# Patient Record
Sex: Male | Born: 1970
Health system: Southern US, Community
[De-identification: ages and names within clinical notes are randomized; demographics above are authoritative.]

## PROBLEM LIST (undated history)

## (undated) DIAGNOSIS — E119 Type 2 diabetes mellitus without complications: Secondary | ICD-10-CM

## (undated) DIAGNOSIS — G4733 Obstructive sleep apnea (adult) (pediatric): Secondary | ICD-10-CM

## (undated) DIAGNOSIS — E7800 Pure hypercholesterolemia, unspecified: Secondary | ICD-10-CM

## (undated) DIAGNOSIS — I2699 Other pulmonary embolism without acute cor pulmonale: Secondary | ICD-10-CM

## (undated) HISTORY — DX: Type 2 diabetes mellitus without complications: E11.9

## (undated) HISTORY — PX: EYE SURGERY: SHX253

## (undated) HISTORY — DX: Obstructive sleep apnea (adult) (pediatric): G47.33

---

## 1998-09-29 ENCOUNTER — Emergency Department (HOSPITAL_COMMUNITY): Admission: EM | Admit: 1998-09-29 | Discharge: 1998-09-29 | Payer: Self-pay | Admitting: Emergency Medicine

## 1998-10-20 ENCOUNTER — Emergency Department (HOSPITAL_COMMUNITY): Admission: EM | Admit: 1998-10-20 | Discharge: 1998-10-20 | Payer: Self-pay | Admitting: Internal Medicine

## 1999-06-26 ENCOUNTER — Emergency Department (HOSPITAL_COMMUNITY): Admission: EM | Admit: 1999-06-26 | Discharge: 1999-06-26 | Payer: Self-pay | Admitting: Emergency Medicine

## 2003-11-10 ENCOUNTER — Emergency Department (HOSPITAL_COMMUNITY): Admission: EM | Admit: 2003-11-10 | Discharge: 2003-11-10 | Payer: Self-pay | Admitting: Emergency Medicine

## 2011-02-25 ENCOUNTER — Emergency Department (HOSPITAL_COMMUNITY)
Admission: EM | Admit: 2011-02-25 | Discharge: 2011-02-26 | Disposition: A | Discharge status: Home / Self Care | Payer: BC Managed Care – PPO | Attending: Emergency Medicine | Admitting: Emergency Medicine

## 2011-02-25 DIAGNOSIS — R221 Localized swelling, mass and lump, neck: Secondary | ICD-10-CM | POA: Insufficient documentation

## 2011-02-25 DIAGNOSIS — H9209 Otalgia, unspecified ear: Secondary | ICD-10-CM | POA: Insufficient documentation

## 2011-02-25 DIAGNOSIS — H6690 Otitis media, unspecified, unspecified ear: Secondary | ICD-10-CM | POA: Insufficient documentation

## 2011-02-25 DIAGNOSIS — H60399 Other infective otitis externa, unspecified ear: Secondary | ICD-10-CM | POA: Insufficient documentation

## 2011-02-25 DIAGNOSIS — R220 Localized swelling, mass and lump, head: Secondary | ICD-10-CM | POA: Insufficient documentation

## 2011-02-26 ENCOUNTER — Encounter (HOSPITAL_COMMUNITY): Payer: Self-pay

## 2011-02-26 ENCOUNTER — Emergency Department (HOSPITAL_COMMUNITY): Payer: BC Managed Care – PPO

## 2011-02-26 MED ORDER — IOHEXOL 300 MG/ML  SOLN: 100.0000 mL | Freq: Once | INTRAMUSCULAR | Status: DC | PRN

## 2011-12-18 ENCOUNTER — Encounter (HOSPITAL_COMMUNITY): Payer: Self-pay | Admitting: *Deleted

## 2011-12-18 ENCOUNTER — Emergency Department (HOSPITAL_COMMUNITY): Payer: BC Managed Care – PPO

## 2011-12-18 ENCOUNTER — Inpatient Hospital Stay (HOSPITAL_COMMUNITY)
Admission: EM | Admit: 2011-12-18 | Discharge: 2011-12-24 | DRG: 541 | Disposition: A | Discharge status: Home / Self Care | Payer: BC Managed Care – PPO | Attending: Internal Medicine | Admitting: Internal Medicine

## 2011-12-18 DIAGNOSIS — R509 Fever, unspecified: Secondary | ICD-10-CM | POA: Diagnosis not present

## 2011-12-18 DIAGNOSIS — J159 Unspecified bacterial pneumonia: Secondary | ICD-10-CM | POA: Diagnosis not present

## 2011-12-18 DIAGNOSIS — R079 Chest pain, unspecified: Secondary | ICD-10-CM | POA: Diagnosis present

## 2011-12-18 DIAGNOSIS — D72829 Elevated white blood cell count, unspecified: Secondary | ICD-10-CM | POA: Diagnosis present

## 2011-12-18 DIAGNOSIS — Z832 Family history of diseases of the blood and blood-forming organs and certain disorders involving the immune mechanism: Secondary | ICD-10-CM

## 2011-12-18 DIAGNOSIS — R091 Pleurisy: Secondary | ICD-10-CM | POA: Diagnosis present

## 2011-12-18 DIAGNOSIS — I498 Other specified cardiac arrhythmias: Secondary | ICD-10-CM | POA: Diagnosis present

## 2011-12-18 DIAGNOSIS — R7309 Other abnormal glucose: Secondary | ICD-10-CM | POA: Diagnosis present

## 2011-12-18 DIAGNOSIS — I2699 Other pulmonary embolism without acute cor pulmonale: Secondary | ICD-10-CM

## 2011-12-18 DIAGNOSIS — R7303 Prediabetes: Secondary | ICD-10-CM | POA: Diagnosis present

## 2011-12-18 DIAGNOSIS — J189 Pneumonia, unspecified organism: Secondary | ICD-10-CM | POA: Diagnosis present

## 2011-12-18 DIAGNOSIS — Z79899 Other long term (current) drug therapy: Secondary | ICD-10-CM

## 2011-12-18 DIAGNOSIS — E7800 Pure hypercholesterolemia, unspecified: Secondary | ICD-10-CM | POA: Diagnosis present

## 2011-12-18 DIAGNOSIS — R0602 Shortness of breath: Secondary | ICD-10-CM | POA: Diagnosis present

## 2011-12-18 HISTORY — DX: Pure hypercholesterolemia, unspecified: E78.00

## 2011-12-18 LAB — COMPREHENSIVE METABOLIC PANEL
AST: 24 U/L (ref 0–37)
Albumin: 3.9 g/dL (ref 3.5–5.2)
BUN: 10 mg/dL (ref 6–23)
Calcium: 9.8 mg/dL (ref 8.4–10.5)
Chloride: 99 mEq/L (ref 96–112)
Creatinine, Ser: 1.18 mg/dL (ref 0.50–1.35)
Total Bilirubin: 0.3 mg/dL (ref 0.3–1.2)
Total Protein: 7.5 g/dL (ref 6.0–8.3)

## 2011-12-18 LAB — APTT: aPTT: 28 seconds (ref 24–37)

## 2011-12-18 LAB — CBC
HCT: 41.6 % (ref 39.0–52.0)
MCHC: 32.7 g/dL (ref 30.0–36.0)
MCV: 68.4 fL — ABNORMAL LOW (ref 78.0–100.0)
Platelets: 275 10*3/uL (ref 150–400)
RDW: 14.7 % (ref 11.5–15.5)
WBC: 8.1 10*3/uL (ref 4.0–10.5)

## 2011-12-18 LAB — D-DIMER, QUANTITATIVE: D-Dimer, Quant: 1.04 ug/mL-FEU — ABNORMAL HIGH (ref 0.00–0.48)

## 2011-12-18 LAB — POCT I-STAT TROPONIN I: Troponin i, poc: 0 ng/mL (ref 0.00–0.08)

## 2011-12-18 LAB — PROTIME-INR
INR: 0.95 (ref 0.00–1.49)
Prothrombin Time: 12.9 seconds (ref 11.6–15.2)

## 2011-12-18 MED ORDER — SODIUM CHLORIDE 0.9 % IV SOLN
1000.0000 mL | INTRAVENOUS | Status: DC
  Administered 2011-12-18: 1000 mL via INTRAVENOUS

## 2011-12-18 MED ORDER — ASPIRIN 81 MG PO CHEW
324.0000 mg | CHEWABLE_TABLET | Freq: Once | ORAL | Status: AC
  Administered 2011-12-18: 324 mg via ORAL
  Filled 2011-12-18: qty 4

## 2011-12-18 MED ORDER — IOHEXOL 300 MG/ML  SOLN
100.0000 mL | Freq: Once | INTRAMUSCULAR | Status: AC | PRN
  Administered 2011-12-18: 100 mL via INTRAVENOUS

## 2011-12-18 MED ORDER — MORPHINE SULFATE 4 MG/ML IJ SOLN
4.0000 mg | Freq: Once | INTRAMUSCULAR | Status: AC
  Administered 2011-12-18: 4 mg via INTRAVENOUS
  Filled 2011-12-18: qty 1

## 2011-12-18 NOTE — ED Notes (Signed)
Pt reports rt chest pain that awoke him from his sleep yesterday morning approx 0200 - pt denies any dizziness, syncope, shortness of breath, or n/v. Pt denies any extended time spent sitting, also reports increased pain w/ movement and deep inspiration. Pt in no acute distress on assessment, speaking complete sentences.

## 2011-12-18 NOTE — ED Provider Notes (Signed)
History     CSN: [id number]  Arrival date & time 12/18/11  2019   First MD Initiated Contact with Patient 12/18/11 2038      Chief Complaint  Patient presents with  . Chest Pain    Patient is a 41 y.o. male presenting with chest pain. The history is provided by the patient.  Chest Pain The chest pain began 12 - 24 hours ago. Chest pain occurs constantly. The chest pain is worsening. The pain is associated with breathing. The pain is currently at 4/10. The quality of the pain is described as pleuritic and sharp. The pain radiates to the upper back. Chest pain is worsened by deep breathing. Primary symptoms include shortness of breath and cough. Pertinent negatives for primary symptoms include no fever and no vomiting.  Pertinent negatives for past medical history include no CAD and no PE. Family history comments: Lung cancer in mother     Past Medical History  Diagnosis Date  . Hypercholesteremia     History reviewed. No pertinent past surgical history.  History reviewed. No pertinent family history.  History  Substance Use Topics  . Smoking status: Former Games developer  . Smokeless tobacco: Not on file  . Alcohol Use: Yes     occ      Review of Systems  Constitutional: Negative for fever.  Respiratory: Positive for cough and shortness of breath.   Cardiovascular: Positive for chest pain.  Gastrointestinal: Negative for vomiting.  All other systems reviewed and are negative.    Allergies  Review of patient's allergies indicates no known allergies.  Home Medications   Current Outpatient Rx  Name Route Sig Dispense Refill  . OMEGA-3 FATTY ACIDS 1000 MG PO CAPS Oral Take 2 g by mouth daily.    . ADULT MULTIVITAMIN W/MINERALS CH Oral Take 1 tablet by mouth daily.    Marland Kitchen OMEPRAZOLE 20 MG PO CPDR Oral Take 20 mg by mouth daily as needed. For indigestion.    Marland Kitchen PRESCRIPTION MEDICATION Oral Take 1 tablet by mouth daily. Cholesterol medication.    Marland Kitchen VITAMIN C 500 MG PO TABS Oral  Take 500 mg by mouth daily.      BP 162/96  Pulse 104  Temp 99.2 F (37.3 C)  Resp 20  Ht 5\' 9"  (1.753 m)  Wt 246 lb (111.585 kg)  BMI 36.33 kg/m2  SpO2 95%  Physical Exam  Nursing note and vitals reviewed. Constitutional: He appears well-developed and well-nourished. No distress.  HENT:  Head: Normocephalic and atraumatic.  Right Ear: External ear normal.  Left Ear: External ear normal.  Eyes: Conjunctivae are normal. Right eye exhibits no discharge. Left eye exhibits no discharge. No scleral icterus.  Neck: Neck supple. No tracheal deviation present.  Cardiovascular: Normal rate, regular rhythm and intact distal pulses.   Pulmonary/Chest: Effort normal and breath sounds normal. No stridor. No respiratory distress. He has no wheezes. He has no rales.  Abdominal: Soft. Bowel sounds are normal. He exhibits no distension. There is no tenderness. There is no rebound and no guarding.  Musculoskeletal: He exhibits no edema and no tenderness.  Neurological: He is alert. He has normal strength. No sensory deficit. Cranial nerve deficit:  no gross defecits noted. He exhibits normal muscle tone. He displays no seizure activity. Coordination normal.  Skin: Skin is warm and dry. No rash noted.  Psychiatric: He has a normal mood and affect.    ED Course  Procedures (including critical care time)  Rate: 101  Rhythm:  sinus tahcycardia  QRS Axis: normal  Intervals: normal  ST/T Wave abnormalities: normal  Conduction Disutrbances:none  Narrative Interpretation:   Old EKG Reviewed: none available CRITICAL CARE Performed by: Linwood Dibbles R Total critical care time: 40 Critical care time was exclusive of separately billable procedures and treating other patients. Critical care was necessary to treat or prevent imminent or life-threatening deterioration. Critical care was time spent personally by me on the following activities: development of treatment plan with patient and/or surrogate as  well as nursing, discussions with consultants, evaluation of patient's response to treatment, examination of patient, obtaining history from patient or surrogate, ordering and performing treatments and interventions, ordering and review of laboratory studies, ordering and review of radiographic studies, pulse oximetry and re-evaluation of patient's condition.   Labs Reviewed  CBC - Abnormal; Notable for the following:    RBC 6.08 (*)    MCV 68.4 (*)    MCH 22.4 (*)    All other components within normal limits  COMPREHENSIVE METABOLIC PANEL - Abnormal; Notable for the following:    Glucose, Bld 120 (*)    GFR calc non Af Amer 75 (*)    GFR calc Af Amer 86 (*)    All other components within normal limits  D-DIMER, QUANTITATIVE - Abnormal; Notable for the following:    D-Dimer, Quant 1.04 (*)    All other components within normal limits  PROTIME-INR  APTT  POCT I-STAT TROPONIN I   Dg Chest 2 View  12/18/2011  *RADIOLOGY REPORT*  Clinical Data: Chest and right-sided back pain.  Shortness of breath.  CHEST - 2 VIEW  Comparison: No priors.  Findings: Lung volumes are low.  There are bibasilar opacities which are linear in appearance, favored to represent areas of atelectasis.  Underlying air space consolidation is difficult to its entirely exclude, but is not strongly favored.  There is minimal blunting of the costophrenic sulci which may be secondary to the low lung volumes, however, small bilateral pleural effusions would be difficult to exclude.  Pulmonary vasculature is normal. Cardiomediastinal silhouette is within normal limits.  IMPRESSION: 1.  Low lung volumes with bibasilar opacities are favored to represent areas of subsegmental atelectasis, however, underlying airspace consolidation from aspiration or infection is difficult to exclude.  There may also be small bilateral pleural effusions.  Original Report Authenticated By: Florencia Reasons, M.D.   Ct Angio Chest W/cm &/or Wo  Cm  12/18/2011  *RADIOLOGY REPORT*  Clinical Data: Chest pain.  Elevated D-dimer.  CT ANGIOGRAPHY CHEST  Technique:  Multidetector CT imaging of the chest using the standard protocol during bolus administration of intravenous contrast. Multiplanar reconstructed images including MIPs were obtained and reviewed to evaluate the vascular anatomy.  Contrast: OMNIPAQUE IOHEXOL 300 MG/ML  SOLN  Comparison: None  Findings: The chest wall is unremarkable.  No supraclavicular or axillary adenopathy.  The thyroid gland is normal.  The bony thorax is intact.  The thoracic vertebral bodies are normally aligned.  The heart is upper limits of normal in size.  No pericardial effusion.  No mediastinal or hilar lymphadenopathy.  The aorta is normal in caliber.  No dissection.  The esophagus is grossly normal.  The pulmonary arterial tree is fairly well opacified.  There is a large central pulmonary embolus involving the right pulmonary artery and lower lobe branch vessels.  Small left-sided pulmonary emboli are also suspected.  Examination of the lung parenchyma demonstrates vascular congestion and bibasilar atelectasis.  Vague ground-glass opacity could reflect  mild edema.  No definite pleural effusions.  The upper abdomen is unremarkable.  IMPRESSION:  1.  Bilateral pulmonary emboli with a large central embolus on the right.  Right ventricular strain pattern noted. 2.  Vascular congestion, mild edema and areas of atelectasis. 3.  Normal thoracic aorta.  Critical Value/emergent results were called by telephone at the time of interpretation to  Dr. Linwood Dibbles, who verbally acknowledged these results.  Original Report Authenticated By: P. Loralie Champagne, M.D.     1. Pulmonary embolism       MDM  Pt with a large PE on CT scan.  Discussed with Dr Darrick Penna , PCCM, regarding the possible use of tpa considering his RV strain and large tumor burden.  Does not recommend this treatment at this time.  Will consult the  hospitalist service for admission.  Heparin IV has been ordered        Celene Kras, MD 12/18/11 2355

## 2011-12-18 NOTE — ED Notes (Addendum)
Pt c/o right chest/epigastric pain that radiates to back. Also reports indigestion. Reports pain started last night. Pt reports pain is worse when laying. Pt denies vomiting/diarrhea. States "I belched all day at work and passed gas."

## 2011-12-19 DIAGNOSIS — I10 Essential (primary) hypertension: Secondary | ICD-10-CM

## 2011-12-19 DIAGNOSIS — R079 Chest pain, unspecified: Secondary | ICD-10-CM

## 2011-12-19 DIAGNOSIS — R0602 Shortness of breath: Secondary | ICD-10-CM

## 2011-12-19 DIAGNOSIS — I2699 Other pulmonary embolism without acute cor pulmonale: Secondary | ICD-10-CM | POA: Diagnosis present

## 2011-12-19 LAB — BASIC METABOLIC PANEL
CO2: 26 mEq/L (ref 19–32)
Calcium: 9.4 mg/dL (ref 8.4–10.5)
Creatinine, Ser: 1.09 mg/dL (ref 0.50–1.35)
GFR calc non Af Amer: 82 mL/min — ABNORMAL LOW (ref >90)

## 2011-12-19 LAB — CBC
HCT: 41.1 % (ref 39.0–52.0)
MCHC: 31.9 g/dL (ref 30.0–36.0)
Platelets: 262 10*3/uL (ref 150–400)
RDW: 14.9 % (ref 11.5–15.5)
WBC: 8.4 10*3/uL (ref 4.0–10.5)

## 2011-12-19 LAB — HEMOGLOBIN A1C: Hgb A1c MFr Bld: 6.1 % — ABNORMAL HIGH (ref <5.7)

## 2011-12-19 LAB — MRSA PCR SCREENING: MRSA by PCR: NEGATIVE

## 2011-12-19 LAB — ANTITHROMBIN III: AntiThromb III Func: 93 % (ref 75–120)

## 2011-12-19 LAB — HEPARIN LEVEL (UNFRACTIONATED)
Heparin Unfractionated: 0.75 IU/mL — ABNORMAL HIGH (ref 0.30–0.70)
Heparin Unfractionated: 0.67 IU/mL (ref 0.30–0.70)

## 2011-12-19 LAB — PROTIME-INR: INR: 0.97 (ref 0.00–1.49)

## 2011-12-19 LAB — PRO B NATRIURETIC PEPTIDE: Pro B Natriuretic peptide (BNP): 19.4 pg/mL (ref 0–125)

## 2011-12-19 MED ORDER — SODIUM CHLORIDE 0.9 % IV SOLN: INTRAVENOUS | Status: AC

## 2011-12-19 MED ORDER — HYDROCODONE-ACETAMINOPHEN 5-325 MG PO TABS
1.0000 | ORAL_TABLET | ORAL | Status: DC | PRN
  Administered 2011-12-19 (×3): 2 via ORAL
  Filled 2011-12-19 (×3): qty 2

## 2011-12-19 MED ORDER — HEPARIN (PORCINE) IN NACL 100-0.45 UNIT/ML-% IJ SOLN
1800.0000 [IU]/h | INTRAMUSCULAR | Status: DC
  Administered 2011-12-19 – 2011-12-20 (×2): 1500 [IU]/h via INTRAVENOUS
  Administered 2011-12-21 – 2011-12-24 (×5): 1800 [IU]/h via INTRAVENOUS
  Filled 2011-12-19 (×12): qty 250

## 2011-12-19 MED ORDER — HYDRALAZINE HCL 10 MG PO TABS
10.0000 mg | ORAL_TABLET | Freq: Four times a day (QID) | ORAL | Status: DC | PRN
  Administered 2011-12-19: 10 mg via ORAL
  Filled 2011-12-19: qty 1

## 2011-12-19 MED ORDER — WARFARIN SODIUM 7.5 MG PO TABS
7.5000 mg | ORAL_TABLET | Freq: Once | ORAL | Status: DC
  Filled 2011-12-19: qty 1

## 2011-12-19 MED ORDER — WARFARIN SODIUM 10 MG PO TABS
10.0000 mg | ORAL_TABLET | Freq: Once | ORAL | Status: AC
  Administered 2011-12-19: 10 mg via ORAL
  Filled 2011-12-19: qty 1

## 2011-12-19 MED ORDER — WARFARIN - PHARMACIST DOSING INPATIENT: Freq: Every day | Status: DC

## 2011-12-19 MED ORDER — COUMADIN BOOK
1.0000 | Freq: Once | Status: AC
  Administered 2011-12-19: 1
  Filled 2011-12-19: qty 1

## 2011-12-19 MED ORDER — HEPARIN (PORCINE) IN NACL 100-0.45 UNIT/ML-% IJ SOLN
1600.0000 [IU]/h | INTRAMUSCULAR | Status: DC
  Administered 2011-12-19: 1600 [IU]/h via INTRAVENOUS
  Filled 2011-12-19: qty 250

## 2011-12-19 MED ORDER — HEPARIN BOLUS VIA INFUSION
5000.0000 [IU] | Freq: Once | INTRAVENOUS | Status: AC
  Administered 2011-12-19: 5000 [IU] via INTRAVENOUS

## 2011-12-19 MED ORDER — WARFARIN VIDEO
Freq: Once | Status: AC
  Administered 2011-12-20: 12:00:00

## 2011-12-19 NOTE — Progress Notes (Signed)
ANTICOAGULATION CONSULT NOTE - Follow Up Consult  Pharmacy Consult for Heparin Indication: pulmonary embolus  No Known Allergies  Patient Measurements: Height: 5\' 9"  (175.3 cm) Weight: 246 lb (111.585 kg) IBW/kg (Calculated) : 70.7  Heparin Dosing Weight:   Vital Signs: Temp: 99 F (37.2 C) (06/05 1200) Temp src: Oral (06/05 1200) BP: 159/86 mmHg (06/05 0420) Pulse Rate: 97  (06/05 0800)  Labs:  Basename 12/19/11 1310 12/19/11 0610 12/18/11 2145  HGB -- 13.1 13.6  HCT -- 41.1 41.6  PLT -- 262 275  APTT -- -- 28  LABPROT -- 13.1 12.9  INR -- 0.97 0.95  HEPARINUNFRC 0.67 0.75* --  CREATININE -- 1.09 1.18  CKTOTAL -- -- --  CKMB -- -- --  TROPONINI -- -- --    Estimated Creatinine Clearance: 108.8 ml/min (by C-G formula based on Cr of 1.09).   Medications:  Infusions:     . sodium chloride 75 mL/hr at 12/19/11 0500  . heparin 1,500 Units/hr (12/19/11 1300)  . DISCONTD: sodium chloride 1,000 mL (12/18/11 2151)  . DISCONTD: heparin 1,600 Units/hr (12/19/11 0013)    Assessment:  41 yo M with acute bilateral pulmonary emboli started on heparin IV and warfarin on 6/5   Hypercoagulable workup pending (sister and maternal grandmother have both have had a PE)  CBC stable, WNL  First heparin level this morning high, and rate was decreased  HL now therapeutic at 0.67  INR at baseline, <1  Goal of Therapy:  Heparin level 0.3-0.7 units/ml Monitor platelets by anticoagulation protocol: Yes   Plan:   Continue Heparin IV infusion at 1500 units/h (15 ml/hr)  Heparin level in 6 hours to confirm therapeutic level  Daily HL  Warfarin 10mg  PO tonight at 1800  Daily INR   Lynann Beaver PharmD, BCPS Pager [phone] 12/19/2011 1:42 PM

## 2011-12-19 NOTE — Progress Notes (Signed)
Patients BP is 179/100 (118); pt is symptomatic; MD from [facility] notified and new PRN orders were received. Will continue to monitor the pt.

## 2011-12-19 NOTE — Progress Notes (Addendum)
ANTICOAGULATION CONSULT NOTE - Initial Consult  Pharmacy Consult for Heparin Indication: Bilat. pulmonary embolus  No Known Allergies  Patient Measurements: Height: 5\' 9"  (175.3 cm) Weight: 246 lb (111.585 kg) IBW/kg (Calculated) : 70.7  Heparin Dosing Weight:   Vital Signs: Temp: 99.2 F (37.3 C) (06/04 2023) BP: 140/82 mmHg (06/05 0013) Pulse Rate: 101  (06/05 0013)  Labs:  [facility] 12/18/11 2145  HGB 13.6  HCT 41.6  PLT 275  APTT 28  LABPROT 12.9  INR 0.95  HEPARINUNFRC --  CREATININE 1.18  CKTOTAL --  CKMB --  TROPONINI --    Estimated Creatinine Clearance: 100.5 ml/min (by C-G formula based on Cr of 1.18).   Medical History: Past Medical History  Diagnosis Date  . Hypercholesteremia     Medications:  Infusions:    . sodium chloride    . heparin    . DISCONTD: sodium chloride 1,000 mL (12/18/11 2151)  . DISCONTD: heparin 1,600 Units/hr (12/19/11 0013)    Assessment: Patient with bilat PE.  Heparin started in ED.   Goal of Therapy:  Heparin level 0.3-0.7 units/ml Monitor platelets by anticoagulation protocol: Yes INR 2-3   Plan:  Heparin bolus and drip at 1600 units/hr (started in ED). Daily heparin level and CBC and INR. Daily PT/INR, Coumadin Book/video Warfarin 7.5mg  po x1 at 1800 Check heparin level at 0630 am   Darlina Guys, Jacquenette Shone Crowford 12/19/2011,2:34 AM  HL this am 0.75, no issues per RN> Will decrease to 1500 units/hr and recheck level at [address] Darlina Guys PharmD, BCPS  12/19/2011, 7:12 AM

## 2011-12-19 NOTE — Progress Notes (Signed)
VASCULAR LAB PRELIMINARY  PRELIMINARY  PRELIMINARY  PRELIMINARY  Bilateral lower extremity venous duplex  completed.    Preliminary report:  Bilateral:  No evidence of DVT, superficial thrombosis, or Baker's Cyst.   Terance Hart, RVT 12/19/2011, 9:47 AM

## 2011-12-19 NOTE — Progress Notes (Signed)
Subjective:  Patient seen and examined this morning. Still c/o some right sided chest discomfort on deep inspiration. Admission H&P reviewed.  Objective:  Vital signs in last 24 hours:  Filed Vitals:   12/19/11 0200 12/19/11 0400 12/19/11 0420 12/19/11 0800  BP: 141/89  159/86   Pulse: 93  84   Temp: 99.2 F (37.3 C) 99.9 F (37.7 C) 99.9 F (37.7 C) 99.9 F (37.7 C)  TempSrc: Oral Oral Oral Oral  Resp: 26  23   Height:      Weight:      SpO2: 96%  95%     Intake/Output from previous day:   Intake/Output Summary (Last 24 hours) at 12/19/11 0854 Last data filed at 12/19/11 0845  Gross per 24 hour  Intake    364 ml  Output   1200 ml  Net   -836 ml    Physical Exam:  General: middle aged obese in no acute distress. HEENT: no pallor, no icterus, moist oral mucosa, no JVD, no lymphadenopathy Heart: Normal  s1 &s2  Regular rate and rhythm, without murmurs, rubs, gallops. Lungs: Clear to auscultation bilaterally. Abdomen: Soft, nontender, nondistended, positive bowel sounds. Extremities: No clubbing cyanosis or edema with positive pedal pulses. Neuro: Alert, awake, oriented x3, nonfocal.   Lab Results:  Basic Metabolic Panel:    Component Value Date/Time   NA 136 12/19/2011 0610   K 4.1 12/19/2011 0610   CL 100 12/19/2011 0610   CO2 26 12/19/2011 0610   BUN 11 12/19/2011 0610   CREATININE 1.09 12/19/2011 0610   GLUCOSE 125* 12/19/2011 0610   CALCIUM 9.4 12/19/2011 0610   CBC:    Component Value Date/Time   WBC 8.4 12/19/2011 0610   HGB 13.1 12/19/2011 0610   HCT 41.1 12/19/2011 0610   PLT 262 12/19/2011 0610   MCV 68.7* 12/19/2011 0610    Recent Results (from the past 240 hour(s))  MRSA PCR SCREENING     Status: Normal   Collection Time   12/19/11  2:59 AM      Component Value Range Status Comment   MRSA by PCR NEGATIVE  NEGATIVE  Final     Studies/Results: Dg Chest 2 View  12/18/2011  *RADIOLOGY REPORT*  Clinical Data: Chest and right-sided back pain.  Shortness of  breath.  CHEST - 2 VIEW  Comparison: No priors.  Findings: Lung volumes are low.  There are bibasilar opacities which are linear in appearance, favored to represent areas of atelectasis.  Underlying air space consolidation is difficult to its entirely exclude, but is not strongly favored.  There is minimal blunting of the costophrenic sulci which may be secondary to the low lung volumes, however, small bilateral pleural effusions would be difficult to exclude.  Pulmonary vasculature is normal. Cardiomediastinal silhouette is within normal limits.  IMPRESSION: 1.  Low lung volumes with bibasilar opacities are favored to represent areas of subsegmental atelectasis, however, underlying airspace consolidation from aspiration or infection is difficult to exclude.  There may also be small bilateral pleural effusions.  Original Report Authenticated By: Florencia Reasons, M.D.   Ct Angio Chest W/cm &/or Wo Cm  12/18/2011  *RADIOLOGY REPORT*  Clinical Data: Chest pain.  Elevated D-dimer.  CT ANGIOGRAPHY CHEST  Technique:  Multidetector CT imaging of the chest using the standard protocol during bolus administration of intravenous contrast. Multiplanar reconstructed images including MIPs were obtained and reviewed to evaluate the vascular anatomy.  Contrast: OMNIPAQUE IOHEXOL 300 MG/ML  SOLN  Comparison:  None  Findings: The chest wall is unremarkable.  No supraclavicular or axillary adenopathy.  The thyroid gland is normal.  The bony thorax is intact.  The thoracic vertebral bodies are normally aligned.  The heart is upper limits of normal in size.  No pericardial effusion.  No mediastinal or hilar lymphadenopathy.  The aorta is normal in caliber.  No dissection.  The esophagus is grossly normal.  The pulmonary arterial tree is fairly well opacified.  There is a large central pulmonary embolus involving the right pulmonary artery and lower lobe branch vessels.  Small left-sided pulmonary emboli are also suspected.   Examination of the lung parenchyma demonstrates vascular congestion and bibasilar atelectasis.  Vague ground-glass opacity could reflect mild edema.  No definite pleural effusions.  The upper abdomen is unremarkable.  IMPRESSION:  1.  Bilateral pulmonary emboli with a large central embolus on the right.  Right ventricular strain pattern noted. 2.  Vascular congestion, mild edema and areas of atelectasis. 3.  Normal thoracic aorta.  Critical Value/emergent results were called by telephone at the time of interpretation to  Dr. Linwood Dibbles, who verbally acknowledged these results.  Original Report Authenticated By: P. Loralie Champagne, M.D.    Medications: Scheduled Meds:   . aspirin  324 mg Oral Once  . coumadin book  1 each Does not apply Once  . heparin  5,000 Units Intravenous Once  .  morphine injection  4 mg Intravenous Once  . warfarin  7.5 mg Oral ONCE-1800  . warfarin   Does not apply Once  . Warfarin - Pharmacist Dosing Inpatient   Does not apply q1800   Continuous Infusions:   . sodium chloride 75 mL/hr at 12/19/11 0500  . heparin    . DISCONTD: sodium chloride 1,000 mL (12/18/11 2151)  . DISCONTD: heparin 1,600 Units/hr (12/19/11 0013)   PRN Meds:.HYDROcodone-acetaminophen, iohexol  Assessment/Plan:   *Pulmonary emboli bilateral Patient does have significant hx of blood clots in family members ( his sister, uncle and maternal grandmother) B/l PE with large right sided central PE WITH some RV train noted on CT angio. patient was tachycardic with low grade temp and short of breath on presentation and placed on stepdown monitofin hypercoagulable w/up sent on admisison  continue IV heparin drip for now. Possibly could be discharged on po xaralto  Vs sq lovenox once more stable Will check LE doppler for DVT Continue stepdown monitoring for now   right sided Chest pain Likely related to underlying PE and currently improving. Continue prn vicodin  DVT prophylaxis On IV  heaprin  Diet: regular      LOS: 1 day   [name] [name] 12/19/2011, 8:54 AM

## 2011-12-19 NOTE — Progress Notes (Addendum)
PHARMACY BRIEF NOTE - ANTICOAGULATION  Pharmacy Consult for:   IV Heparin Indication:  Pulmonary embolus   With infusion of 1500 units/hr, the heparin level drawn at 19:55 tonight is reported as 0.62 units/ml.  This level is within the therapeutic range  Goal of Therapy:   Heparin level 0.3-0.7 units/ml  Monitor platelets by anticoagulation protocol: Yes   Plan:   Continue the current infusion rate overnight.  Next heparin level and CBC with morning labs.  Polo Riley R.Ph. 9:44 PM 12/19/2011

## 2011-12-19 NOTE — H&P (Signed)
Chief Complaint:  Pain chest  HPI:  41 year old male who approximately 2 AM yesterday  morning had sudden right-sided chest pain and shortness of breath that woke him up from sleep. He denies any recent traveling, reason lower extremity edema or swelling or pain in his calf , trauma. He has not had any recent surgeries or immunization. He denies any recent illnesses, no fevers, rashes, nausea, vomiting, or diarrhea. He has one sister who had a pulmonary emboli last year attributed to estrogen use and a maternal grandmother also had a pulmonary emboli in her 70s. He has never had a blood clot before. CT scan of his chest shows significant pulmonary emboli bilaterally.   Review of Systems:   otherwise negative   Past Medical History: Past Medical History  Diagnosis Date  . Hypercholesteremia    History reviewed. No pertinent past surgical history.  Medications: Prior to Admission medications   Medication Sig Start Date End Date Taking? Authorizing Provider  fish oil-omega-3 fatty acids 1000 MG capsule Take 2 g by mouth daily.   Yes Historical Provider, MD  Multiple Vitamin (MULITIVITAMIN WITH MINERALS) TABS Take 1 tablet by mouth daily.   Yes Historical Provider, MD  omeprazole (PRILOSEC) 20 MG capsule Take 20 mg by mouth daily as needed. For indigestion.   Yes Historical Provider, MD  PRESCRIPTION MEDICATION Take 1 tablet by mouth daily. Cholesterol medication.   Yes Historical Provider, MD  vitamin C (ASCORBIC ACID) 500 MG tablet Take 500 mg by mouth daily.   Yes Historical Provider, MD    Allergies:  No Known Allergies  Social History:  reports that he has quit smoking. He does not have any smokeless tobacco history on file. He reports that he drinks alcohol. He reports that he does not use illicit drugs.  Family History: And he has a sister who is a year or he had a pulmonary emboli last year. And a maternal grandmother had blood clots in the lungs in her 31s.   Physical  Exam: Filed Vitals:   12/18/11 2023 12/18/11 2028  BP: 162/96   Pulse: 104   Temp: 99.2 F (37.3 C)   Resp: 20   Height:  5\' 9"  (1.753 m)  Weight:  111.585 kg (246 lb)  SpO2: 95%    General appearance: alert, cooperative and no distress Lungs: clear to auscultation bilaterally Heart: regular rate and rhythm, S1, S2 normal, no murmur, click, rub or gallop Abdomen: soft, non-tender; bowel sounds normal; no masses,  no organomegaly Extremities: extremities normal, atraumatic, no cyanosis or edema Pulses: 2+ and symmetric Skin: Skin color, texture, turgor normal. No rashes or lesions Neurologic: Grossly normal    Labs on Admission:   [facility] 12/18/11 2145  NA 135  K 3.9  CL 99  CO2 26  GLUCOSE 120*  BUN 10  CREATININE 1.18  CALCIUM 9.8  MG --  PHOS --    Basename 12/18/11 2145  AST 24  ALT 28  ALKPHOS 86  BILITOT 0.3  PROT 7.5  ALBUMIN 3.9    Basename 12/18/11 2145  WBC 8.1  NEUTROABS --  HGB 13.6  HCT 41.6  MCV 68.4*  PLT 275    Radiological Exams on Admission: Dg Chest 2 View  12/18/2011  *RADIOLOGY REPORT*  Clinical Data: Chest and right-sided back pain.  Shortness of breath.  CHEST - 2 VIEW  Comparison: No priors.  Findings: Lung volumes are low.  There are bibasilar opacities which are linear in appearance, favored to represent areas  of atelectasis.  Underlying air space consolidation is difficult to its entirely exclude, but is not strongly favored.  There is minimal blunting of the costophrenic sulci which may be secondary to the low lung volumes, however, small bilateral pleural effusions would be difficult to exclude.  Pulmonary vasculature is normal. Cardiomediastinal silhouette is within normal limits.  IMPRESSION: 1.  Low lung volumes with bibasilar opacities are favored to represent areas of subsegmental atelectasis, however, underlying airspace consolidation from aspiration or infection is difficult to exclude.  There may also be small bilateral  pleural effusions.  Original Report Authenticated By: Florencia Reasons, M.D.   Ct Angio Chest W/cm &/or Wo Cm  12/18/2011  *RADIOLOGY REPORT*  Clinical Data: Chest pain.  Elevated D-dimer.  CT ANGIOGRAPHY CHEST  Technique:  Multidetector CT imaging of the chest using the standard protocol during bolus administration of intravenous contrast. Multiplanar reconstructed images including MIPs were obtained and reviewed to evaluate the vascular anatomy.  Contrast: OMNIPAQUE IOHEXOL 300 MG/ML  SOLN  Comparison: None  Findings: The chest wall is unremarkable.  No supraclavicular or axillary adenopathy.  The thyroid gland is normal.  The bony thorax is intact.  The thoracic vertebral bodies are normally aligned.  The heart is upper limits of normal in size.  No pericardial effusion.  No mediastinal or hilar lymphadenopathy.  The aorta is normal in caliber.  No dissection.  The esophagus is grossly normal.  The pulmonary arterial tree is fairly well opacified.  There is a large central pulmonary embolus involving the right pulmonary artery and lower lobe branch vessels.  Small left-sided pulmonary emboli are also suspected.  Examination of the lung parenchyma demonstrates vascular congestion and bibasilar atelectasis.  Vague ground-glass opacity could reflect mild edema.  No definite pleural effusions.  The upper abdomen is unremarkable.  IMPRESSION:  1.  Bilateral pulmonary emboli with a large central embolus on the right.  Right ventricular strain pattern noted. 2.  Vascular congestion, mild edema and areas of atelectasis. 3.  Normal thoracic aorta.  Critical Value/emergent results were called by telephone at the time of interpretation to  Dr. Linwood Dibbles, who verbally acknowledged these results.  Original Report Authenticated By: P. Loralie Champagne, M.D.    Assessment/Plan Present on Admission:  41 year old male with acute bilateral pulmonary emboli  .Chest pain .Pulmonary emboli bilateral .SOB (shortness of  breath)  Placed in  the step down unit on a heparin drip with Coumadin. I sent him for a hypercoagulable workup. His troponin is negative and EKG with some mild sinus tachycardia. We'll monitor him very closely however he is currently hemodynamically stable. I recommended to his sister who is here and is now off Coumadin speak to her doctor about a hypercoagulable workup as they both probably have some underlying clotting disorder. Provide some pain medications for the chest pain.     Shabreka Coulon A [phone] 12/19/2011, 12:14 AM

## 2011-12-20 ENCOUNTER — Inpatient Hospital Stay (HOSPITAL_COMMUNITY): Payer: BC Managed Care – PPO

## 2011-12-20 DIAGNOSIS — R0602 Shortness of breath: Secondary | ICD-10-CM

## 2011-12-20 DIAGNOSIS — I10 Essential (primary) hypertension: Secondary | ICD-10-CM

## 2011-12-20 DIAGNOSIS — R079 Chest pain, unspecified: Secondary | ICD-10-CM

## 2011-12-20 DIAGNOSIS — R509 Fever, unspecified: Secondary | ICD-10-CM

## 2011-12-20 LAB — CBC
HCT: 40.5 % (ref 39.0–52.0)
RBC: 5.88 MIL/uL — ABNORMAL HIGH (ref 4.22–5.81)
RDW: 14.8 % (ref 11.5–15.5)
WBC: 10.8 10*3/uL — ABNORMAL HIGH (ref 4.0–10.5)

## 2011-12-20 LAB — LUPUS ANTICOAGULANT PANEL
DRVVT: 35.9 secs (ref <45.1)
PTT Lupus Anticoagulant: 76.6 secs — ABNORMAL HIGH (ref 28.0–43.0)
PTTLA 4:1 Mix: 78.8 secs — ABNORMAL HIGH (ref 28.0–43.0)
PTTLA Confirmation: 2.8 secs (ref <8.0)

## 2011-12-20 LAB — HEPARIN LEVEL (UNFRACTIONATED): Heparin Unfractionated: 0.49 IU/mL (ref 0.30–0.70)

## 2011-12-20 LAB — PROTEIN C, TOTAL: Protein C, Total: 116 % (ref 72–160)

## 2011-12-20 LAB — FACTOR 5 LEIDEN

## 2011-12-20 MED ORDER — SENNOSIDES-DOCUSATE SODIUM 8.6-50 MG PO TABS
1.0000 | ORAL_TABLET | Freq: Two times a day (BID) | ORAL | Status: DC
  Administered 2011-12-20 – 2011-12-21 (×2): 1 via ORAL
  Filled 2011-12-20 (×9): qty 1

## 2011-12-20 MED ORDER — WARFARIN SODIUM 10 MG PO TABS
10.0000 mg | ORAL_TABLET | Freq: Once | ORAL | Status: AC
  Administered 2011-12-20: 10 mg via ORAL
  Filled 2011-12-20: qty 1

## 2011-12-20 MED ORDER — LEVOFLOXACIN IN D5W 750 MG/150ML IV SOLN
750.0000 mg | INTRAVENOUS | Status: DC
  Administered 2011-12-20 – 2011-12-23 (×4): 750 mg via INTRAVENOUS
  Filled 2011-12-20 (×6): qty 150

## 2011-12-20 NOTE — Progress Notes (Signed)
Subjective: Patient informs his SOB and chest discomfort. Noted to have low grade temp again and increased respiratory rate.  Objective:    Vital signs in last 24 hours:  Filed Vitals:   12/20/11 0415 12/20/11 0500 12/20/11 0800 12/20/11 0900  BP: 141/94   150/87  Pulse: 90   92  Temp:   99.5 F (37.5 C)   TempSrc:   Oral   Resp: 24   29  Height:      Weight:  112.4 kg (247 lb 12.8 oz)    SpO2: 89%   93%    Intake/Output from previous day:   Intake/Output Summary (Last 24 hours) at 12/20/11 1151 Last data filed at 12/20/11 1100  Gross per 24 hour  Intake    420 ml  Output   1500 ml  Net  -1080 ml    Physical Exam:  General: middle aged obese in no acute distress.  HEENT: no pallor, no icterus, moist oral mucosa, no JVD, no lymphadenopathy  Heart: Normal s1 &s2 Regular rate and rhythm, without murmurs, rubs, gallops.  Lungs: Clear to auscultation bilaterally.  Abdomen: Soft, nontender, nondistended, positive bowel sounds.  Extremities: No clubbing cyanosis or edema with positive pedal pulses.  Neuro: Alert, awake, oriented x3, nonfocal.   Lab Results:  Basic Metabolic Panel:    Component Value Date/Time   NA 136 12/19/2011 0610   K 4.1 12/19/2011 0610   CL 100 12/19/2011 0610   CO2 26 12/19/2011 0610   BUN 11 12/19/2011 0610   CREATININE 1.09 12/19/2011 0610   GLUCOSE 125* 12/19/2011 0610   CALCIUM 9.4 12/19/2011 0610   CBC:    Component Value Date/Time   WBC 10.8* 12/20/2011 0332   HGB 13.1 12/20/2011 0332   HCT 40.5 12/20/2011 0332   PLT 250 12/20/2011 0332   MCV 68.9* 12/20/2011 0332    Recent Results (from the past 240 hour(s))  MRSA PCR SCREENING     Status: Normal   Collection Time   12/19/11  2:59 AM      Component Value Range Status Comment   MRSA by PCR NEGATIVE  NEGATIVE  Final     Studies/Results: Dg Chest 2 View  12/18/2011  *RADIOLOGY REPORT*  Clinical Data: Chest and right-sided back pain.  Shortness of breath.  CHEST - 2 VIEW  Comparison: No priors.   Findings: Lung volumes are low.  There are bibasilar opacities which are linear in appearance, favored to represent areas of atelectasis.  Underlying air space consolidation is difficult to its entirely exclude, but is not strongly favored.  There is minimal blunting of the costophrenic sulci which may be secondary to the low lung volumes, however, small bilateral pleural effusions would be difficult to exclude.  Pulmonary vasculature is normal. Cardiomediastinal silhouette is within normal limits.  IMPRESSION: 1.  Low lung volumes with bibasilar opacities are favored to represent areas of subsegmental atelectasis, however, underlying airspace consolidation from aspiration or infection is difficult to exclude.  There may also be small bilateral pleural effusions.  Original Report Authenticated By: Florencia Reasons, M.D.   Ct Angio Chest W/cm &/or Wo Cm  12/18/2011  *RADIOLOGY REPORT*  Clinical Data: Chest pain.  Elevated D-dimer.  CT ANGIOGRAPHY CHEST  Technique:  Multidetector CT imaging of the chest using the standard protocol during bolus administration of intravenous contrast. Multiplanar reconstructed images including MIPs were obtained and reviewed to evaluate the vascular anatomy.  Contrast: OMNIPAQUE IOHEXOL 300 MG/ML  SOLN  Comparison: None  Findings: The chest wall is unremarkable.  No supraclavicular or axillary adenopathy.  The thyroid gland is normal.  The bony thorax is intact.  The thoracic vertebral bodies are normally aligned.  The heart is upper limits of normal in size.  No pericardial effusion.  No mediastinal or hilar lymphadenopathy.  The aorta is normal in caliber.  No dissection.  The esophagus is grossly normal.  The pulmonary arterial tree is fairly well opacified.  There is a large central pulmonary embolus involving the right pulmonary artery and lower lobe branch vessels.  Small left-sided pulmonary emboli are also suspected.  Examination of the lung parenchyma demonstrates  vascular congestion and bibasilar atelectasis.  Vague ground-glass opacity could reflect mild edema.  No definite pleural effusions.  The upper abdomen is unremarkable.  IMPRESSION:  1.  Bilateral pulmonary emboli with a large central embolus on the right.  Right ventricular strain pattern noted. 2.  Vascular congestion, mild edema and areas of atelectasis. 3.  Normal thoracic aorta.  Critical Value/emergent results were called by telephone at the time of interpretation to  Dr. Linwood Dibbles, who verbally acknowledged these results.  Original Report Authenticated By: P. Loralie Champagne, M.D.    Medications: Scheduled Meds:   . coumadin book  1 each Does not apply Once  . warfarin  10 mg Oral ONCE-1800  . warfarin  10 mg Oral ONCE-1800  . warfarin   Does not apply Once  . Warfarin - Pharmacist Dosing Inpatient   Does not apply q1800  . DISCONTD: warfarin  7.5 mg Oral ONCE-1800   Continuous Infusions:   . sodium chloride 75 mL/hr at 12/19/11 0500  . heparin 1,500 Units/hr (12/20/11 0600)   PRN Meds:.hydrALAZINE, HYDROcodone-acetaminophen  Assessment/Plan:   *Pulmonary emboli bilateral  Patient does have significant hx of blood clots in family members ( his sister, uncle and maternal grandmother)  B/l PE with large right sided central PE with some RV strain noted on CT angio. patient was tachycardic with low grade temp and short of breath on presentation and placed on stepdown  hypercoagulable w/up sent on admisison  continue IV heparin drip for now. Possibly could be discharged on po xaralto Vs sq lovenox once more stable   LE doppler negative  for DVT  Continue stepdown monitoring for now  Will check CXR to r/o PNA given low grade temp and leucocytosis   right sided Chest pain  Likely related to underlying PE and currently improving. Continue prn vicodin    Diet: regular   Continue stepdown monitoring for now    LOS: 2 days   [name] [name] 12/20/2011, 11:51 AM

## 2011-12-20 NOTE — Progress Notes (Signed)
ANTICOAGULATION CONSULT NOTE - Follow Up Consult  Pharmacy Consult for Heparin Indication: pulmonary embolus  No Known Allergies  Patient Measurements: Height: 5\' 9"  (175.3 cm) Weight: 247 lb 12.8 oz (112.4 kg) IBW/kg (Calculated) : 70.7   Vital Signs: Temp: 99.9 F (37.7 C) (06/06 0400) Temp src: Oral (06/06 0400) BP: 141/94 mmHg (06/06 0415) Pulse Rate: 90  (06/06 0415)  Labs:  Basename 12/20/11 0332 12/19/11 1955 12/19/11 1310 12/19/11 0610 12/18/11 2145  HGB 13.1 -- -- 13.1 --  HCT 40.5 -- -- 41.1 41.6  PLT 250 -- -- 262 275  APTT -- -- -- -- 28  LABPROT 13.4 -- -- 13.1 12.9  INR 1.00 -- -- 0.97 0.95  HEPARINUNFRC 0.49 0.62 0.67 -- --  CREATININE -- -- -- 1.09 1.18  CKTOTAL -- -- -- -- --  CKMB -- -- -- -- --  TROPONINI -- -- -- -- --    Estimated Creatinine Clearance: 109.1 ml/min (by C-G formula based on Cr of 1.09).   Medications:  Infusions:     . sodium chloride 75 mL/hr at 12/19/11 0500  . heparin 1,500 Units/hr (12/20/11 0600)    Assessment:  41 yo M with acute bilateral pulmonary emboli started on heparin IV and warfarin on 6/5   Hypercoagulable workup pending (sister and maternal grandmother have both have had a PE)  CBC stable, WNL  HL therapeutic  INR remains subtherapeutic as expected after only one dose  Day #2 / minimum of 5 days warfarin / heparin overlap and until INR > 2 for 24 hours.  Goal of Therapy:  INR 2-3 Heparin level 0.3-0.7 units/ml Monitor platelets by anticoagulation protocol: Yes   Plan:   Continue Heparin IV infusion at 1500 units/h (15 ml/hr)  Daily HL  Repeat warfarin 10mg  PO tonight at 1800  Daily INR   Lynann Beaver PharmD, BCPS Pager [phone] 12/20/2011 7:16 AM

## 2011-12-21 DIAGNOSIS — R0602 Shortness of breath: Secondary | ICD-10-CM

## 2011-12-21 DIAGNOSIS — I10 Essential (primary) hypertension: Secondary | ICD-10-CM

## 2011-12-21 DIAGNOSIS — I2699 Other pulmonary embolism without acute cor pulmonale: Principal | ICD-10-CM

## 2011-12-21 DIAGNOSIS — D696 Thrombocytopenia, unspecified: Secondary | ICD-10-CM

## 2011-12-21 LAB — CBC
Hemoglobin: 13 g/dL (ref 13.0–17.0)
MCHC: 31.6 g/dL (ref 30.0–36.0)
RDW: 14.5 % (ref 11.5–15.5)
WBC: 9.3 10*3/uL (ref 4.0–10.5)

## 2011-12-21 LAB — PROTIME-INR: INR: 1.3 (ref 0.00–1.49)

## 2011-12-21 LAB — HEPARIN LEVEL (UNFRACTIONATED): Heparin Unfractionated: 0.25 IU/mL — ABNORMAL LOW (ref 0.30–0.70)

## 2011-12-21 MED ORDER — WARFARIN SODIUM 10 MG PO TABS
10.0000 mg | ORAL_TABLET | Freq: Once | ORAL | Status: AC
  Administered 2011-12-21: 10 mg via ORAL
  Filled 2011-12-21: qty 1

## 2011-12-21 NOTE — Progress Notes (Signed)
Subjective: Patient feels better today. Chest discomfort or SOB. remains afebrile. Was febrile to 101 yesterday and mid leucocytosis. CXR done yesterday with concern for PNA shows possible RLL infiltrate.   Objective:  Vital signs in last 24 hours:  Filed Vitals:   12/21/11 0020 12/21/11 0400 12/21/11 0625 12/21/11 0800  BP: 142/88  146/91 133/87  Pulse: 85  82 80  Temp:  99 F (37.2 C)  98.9 F (37.2 C)  TempSrc:  Oral  Oral  Resp: 23  20 21   Height:      Weight:      SpO2: 95%  95% 94%    Intake/Output from previous day:   Intake/Output Summary (Last 24 hours) at 12/21/11 1126 Last data filed at 12/21/11 1000  Gross per 24 hour  Intake    471 ml  Output   3050 ml  Net  -2579 ml    Physical Exam:  General: middle aged obese in no acute distress.  HEENT: no pallor, no icterus, moist oral mucosa, no JVD, no lymphadenopathy  Heart: Normal s1 &s2 Regular rate and rhythm, without murmurs, rubs, gallops.  Lungs: Clear to auscultation bilaterally.  Abdomen: Soft, nontender, nondistended, positive bowel sounds.  Extremities: No clubbing cyanosis or edema with positive pedal pulses.  Neuro: Alert, awake, oriented x3, nonfocal.    Lab Results:  Basic Metabolic Panel:    Component Value Date/Time   NA 136 12/19/2011 0610   K 4.1 12/19/2011 0610   CL 100 12/19/2011 0610   CO2 26 12/19/2011 0610   BUN 11 12/19/2011 0610   CREATININE 1.09 12/19/2011 0610   GLUCOSE 125* 12/19/2011 0610   CALCIUM 9.4 12/19/2011 0610   CBC:    Component Value Date/Time   WBC 9.3 12/21/2011 0337   HGB 13.0 12/21/2011 0337   HCT 41.2 12/21/2011 0337   PLT 267 12/21/2011 0337   MCV 69.1* 12/21/2011 0337    Recent Results (from the past 240 hour(s))  MRSA PCR SCREENING     Status: Normal   Collection Time   12/19/11  2:59 AM      Component Value Range Status Comment   MRSA by PCR NEGATIVE  NEGATIVE  Final     Studies/Results: Dg Chest 2 View  12/20/2011  *RADIOLOGY REPORT*  Clinical Data: Rule out  pneumonia.  History of low grade fever with mid chest pain and tightness.  No cough  CHEST - 2 VIEW  Comparison: 12/18/2011, chest x-ray and chest CT  Findings: Low lung volumes are present.  Heart and mediastinal contours are stable.  There are persistent increased markings at both lung bases felt to be most compatible with bibasilar atelectasis given the low lung volumes.  A slight increase in density peripherally at the posterior right lung base is somewhat wedge-shaped and a focus of developing pulmonary infarction or pneumonia is not excluded. No definite pleural fluid is seen.  No signs of congestive failure noted.  Gaseous aeration of visualized bowel loops is seen with no air fluid levels.  Bony structures appear intact.  IMPRESSION: Low lung volumes with increased density at the lung bases most compatible with basilar atelectasis. Question right lower lobe developing pneumonia or area of pulmonary infarction.  Original Report Authenticated By: Bertha Stakes, M.D.    Medications: Scheduled Meds:   . levofloxacin (LEVAQUIN) IV  750 mg Intravenous Q24H  . senna-docusate  1 tablet Oral BID  . warfarin  10 mg Oral ONCE-1800  . warfarin  10 mg Oral  ONCE-1800  . warfarin   Does not apply Once  . Warfarin - Pharmacist Dosing Inpatient   Does not apply q1800   Continuous Infusions:   . heparin 1,800 Units/hr (12/21/11 1000)   PRN Meds:.hydrALAZINE, HYDROcodone-acetaminophen  Assessment/Plan: *Pulmonary emboli bilateral  Patient does have significant hx of blood clots in family members ( his sister, uncle and maternal grandmother)  B/l PE with large right sided central PE with some RV strain noted on CT angio. patient was tachycardic with low grade temp and short of breath on presentation and placed on stepdown  hypercoagulable w/up sent on admisison shows negative lupus anticoagulant. Negative factor V leiden, protein C, S and  Anti thrombin III. b2 glycoprotein and  Cardiolipin ab  pending Given large clot burden with SOB and tachycardia patient continued on  IV heparin drip for now.  Discussed with Dr Myna Hidalgo who will see patient today and determine the duration of treatment.. Possibly could be discharged on po xaralto  LE doppler negative for DVT  transfer to tele   Rt sided pneumonia patient had fever with leucocytosis and SOB n 6/6 . CXR suggests possible early rt lower lobe infiltrate vs infarction  started emperiically on levaquin and is now symptomatically improved and afebrile  right sided Chest pain  Likely related to underlying PE and currently improving. Continue prn vicodin    Diet: regular    transfer to tele  PCP called and informed about the hospital course on 6/7    LOS: 3 days   Jacqualine Weichel 12/21/2011, 11:26 AM

## 2011-12-21 NOTE — Progress Notes (Signed)
ANTICOAGULATION CONSULT NOTE - Follow Up Consult  Pharmacy Consult for Heparin Indication: pulmonary embolus  No Known Allergies  Patient Measurements: Height: 5\' 9"  (175.3 cm) Weight: 239 lb 13.8 oz (108.8 kg) IBW/kg (Calculated) : 70.7    Vital Signs: Temp: 99.4 F (37.4 C) (06/07 1235) Temp src: Oral (06/07 1235) BP: 170/84 mmHg (06/07 1235) Pulse Rate: 86  (06/07 1235)  Labs:  Basename 12/21/11 1505 12/21/11 0337 12/20/11 0332 12/19/11 0610 12/18/11 2145  HGB -- 13.0 13.1 -- --  HCT -- 41.2 40.5 41.1 --  PLT -- 267 250 262 --  APTT -- -- -- -- 28  LABPROT -- 16.4* 13.4 13.1 --  INR -- 1.30 1.00 0.97 --  HEPARINUNFRC 0.39 0.25* 0.49 -- --  CREATININE -- -- -- 1.09 1.18  CKTOTAL -- -- -- -- --  CKMB -- -- -- -- --  TROPONINI -- -- -- -- --    Estimated Creatinine Clearance: 107.3 ml/min (by C-G formula based on Cr of 1.09).  Assessment: Heparin level therapeutic at 1800 units/hr of heparin. No bleeding noted. Goal of Therapy:  Heparin level 0.3-0.7 units/ml Monitor PLT by anticoagulation protocol: Yes   Plan:  Continue heparin rate at 1800 units/hr Will f/u AM Hep level and CBC Continue warfarin/heparin overlap for a minimum of 5 days AND until INR>2 x 2 days   Dorethea Clan 12/21/2011,4:16 PM

## 2011-12-21 NOTE — Progress Notes (Signed)
ANTICOAGULATION CONSULT NOTE - Follow Up Consult  Pharmacy Consult for Heparin and Warfarin Indication: pulmonary embolus  No Known Allergies  Patient Measurements: Height: 5\' 9"  (175.3 cm) Weight: 242 lb 15.2 oz (110.2 kg) IBW/kg (Calculated) : 70.7   Vital Signs: Temp: 99 F (37.2 C) (06/07 0400) Temp src: Oral (06/07 0400) BP: 146/91 mmHg (06/07 0625) Pulse Rate: 82  (06/07 0625)  Labs:  Alvira Philips 12/21/11 4098 12/20/11 0332 12/19/11 1955 12/19/11 0610 12/18/11 2145  HGB 13.0 13.1 -- -- --  HCT 41.2 40.5 -- 41.1 --  PLT 267 250 -- 262 --  APTT -- -- -- -- 28  LABPROT 16.4* 13.4 -- 13.1 --  INR 1.30 1.00 -- 0.97 --  HEPARINUNFRC 0.25* 0.49 0.62 -- --  CREATININE -- -- -- 1.09 1.18  CKTOTAL -- -- -- -- --  CKMB -- -- -- -- --  TROPONINI -- -- -- -- --    Estimated Creatinine Clearance: 108 ml/min (by C-G formula based on Cr of 1.09).   Medications:  Infusions:     . heparin 1,500 Units/hr (12/20/11 0600)    Assessment:  41 yo M with acute bilateral pulmonary emboli started on heparin IV and warfarin on 6/5   Hypercoagulable workup pending (sister and maternal grandmother have both have had a PE)  CBC stable, WNL  HL subtherapeutic this am  INR remains subtherapeutic as expected, but starting to trend up  Day #3 of 5 day minimum warfarin / heparin overlap and until INR > 2 for 24 hours.  Drug interaction with Levaquin noted (possible increase in INR)  Goal of Therapy:  INR 2-3 Heparin level 0.3-0.7 units/ml Monitor platelets by anticoagulation protocol: Yes   Plan:   Increase heparin infusion to 1800 units/h (18 ml/hr)  Heparin level 6 hours after rate increase  Repeat warfarin 10mg  PO tonight at 1800  Daily INR  Continue warfarin/heparin overlap for a minimum of 5 days AND until INR >2 x2 days   Darrol Angel, PharmD Pager: [phone] 12/21/2011 8:05 AM

## 2011-12-21 NOTE — Consult Note (Signed)
#  [id number] is note.   I would keep on Heparin gtt over the weekend and then change to Xarelto on Monday (15mg  po bid).  I would keep on therapeutic anti-coagulation for 2 years.  After 3 weeks of bid Xarelto, I would then switch dose to 20mg  po qday.  No need for other studies to be done.  No need to go on "cancer hunt."  I am praying hard for him!  Jeremiah 17:14.  Pete E.

## 2011-12-22 LAB — PROTIME-INR: Prothrombin Time: 24.2 seconds — ABNORMAL HIGH (ref 11.6–15.2)

## 2011-12-22 LAB — CBC
Hemoglobin: 13 g/dL (ref 13.0–17.0)
MCH: 22.1 pg — ABNORMAL LOW (ref 26.0–34.0)
MCHC: 32.3 g/dL (ref 30.0–36.0)
MCV: 68.3 fL — ABNORMAL LOW (ref 78.0–100.0)
Platelets: 310 10*3/uL (ref 150–400)
RBC: 5.89 MIL/uL — ABNORMAL HIGH (ref 4.22–5.81)

## 2011-12-22 NOTE — Progress Notes (Signed)
ANTICOAGULATION CONSULT NOTE - Follow Up Consult  Pharmacy Consult for Heparin and Warfarin Indication: pulmonary embolus  No Known Allergies  Patient Measurements: Height: 5\' 9"  (175.3 cm) Weight: 241 lb 13.5 oz (109.7 kg) IBW/kg (Calculated) : 70.7   Vital Signs: Temp: 98.2 F (36.8 C) (06/08 0459) Temp src: Oral (06/08 0459) BP: 114/74 mmHg (06/08 0459) Pulse Rate: 74  (06/08 0459)  Labs:  Basename 12/22/11 0535 12/21/11 1505 12/21/11 0337 12/20/11 0332  HGB 13.0 -- 13.0 --  HCT 40.2 -- 41.2 40.5  PLT 310 -- 267 250  APTT -- -- -- --  LABPROT -- -- 16.4* 13.4  INR -- -- 1.30 1.00  HEPARINUNFRC -- 0.39 0.25* 0.49  CREATININE -- -- -- --  CKTOTAL -- -- -- --  CKMB -- -- -- --  TROPONINI -- -- -- --    Estimated Creatinine Clearance: 107.8 ml/min (by C-G formula based on Cr of 1.09).   Medications:  Infusions:     . heparin 1,800 Units/hr (12/22/11 0600)    Assessment:  41 yo M with acute bilateral pulmonary emboli started on heparin IV and warfarin on 6/5   Hypercoagulable essentially negative per MD note  CBC stable, WNL  HL therapeutic this am  INR has jumped up significantly overnight and is now therapeutic. No bleeding noted in chart.  Day #4 of 5 day minimum warfarin / heparin overlap and until INR > 2 for 24 hours.  Drug interaction with Levaquin noted (possible increase in INR)  Since INR jumped up so quickly, will not give a dose of warfarin tonight to slow INR progression.  Plan to change to PO Xarelto noted: would initiate this once INR <3. If INR <3, could start Xarelto at the same time heparin IV is discontinued.  Goal of Therapy:  INR 2-3 Heparin level 0.3-0.7 units/ml Monitor platelets by anticoagulation protocol: Yes   Plan:   Continue heparin infusion at 1800 units/h (18 ml/hr)  No warfarin tonight  Daily INR and heparin level while on warfarin and heparin  Continue warfarin/heparin overlap for a minimum of 5 days AND  until INR >2 x2 days  Darrol Angel, PharmD Pager: [phone] 12/22/2011 7:22 AM

## 2011-12-22 NOTE — Progress Notes (Addendum)
Subjective: No overnight issues. Denies any symptoms.  Objective:  Vital signs in last 24 hours:  Filed Vitals:   12/21/11 1235 12/21/11 2155 12/22/11 0100 12/22/11 0459  BP: 170/84 121/81  114/74  Pulse: 86 83  74  Temp: 99.4 F (37.4 C) 98 F (36.7 C)  98.2 F (36.8 C)  TempSrc: Oral Oral  Oral  Resp: 18 20  18   Height: 5\' 9"  (1.753 m)     Weight: 108.8 kg (239 lb 13.8 oz)  109.7 kg (241 lb 13.5 oz)   SpO2: 94% 94%  93%    Intake/Output from previous day:   Intake/Output Summary (Last 24 hours) at 12/22/11 0911 Last data filed at 12/22/11 3086  Gross per 24 hour  Intake    276 ml  Output   1725 ml  Net  -1449 ml    Physical Exam:  General: middle aged obese in no acute distress.  HEENT: no pallor, no icterus, moist oral mucosa, no JVD, no lymphadenopathy  Heart: Normal s1 &s2 Regular rate and rhythm, without murmurs, rubs, gallops.  Lungs: Clear to auscultation bilaterally.  Abdomen: Soft, nontender, nondistended, positive bowel sounds.  Extremities: No clubbing cyanosis or edema with positive pedal pulses.  Neuro: Alert, awake, oriented x3, nonfocal.   Lab Results:  Basic Metabolic Panel:    Component Value Date/Time   NA 136 12/19/2011 0610   K 4.1 12/19/2011 0610   CL 100 12/19/2011 0610   CO2 26 12/19/2011 0610   BUN 11 12/19/2011 0610   CREATININE 1.09 12/19/2011 0610   GLUCOSE 125* 12/19/2011 0610   CALCIUM 9.4 12/19/2011 0610   CBC:    Component Value Date/Time   WBC 7.3 12/22/2011 0535   HGB 13.0 12/22/2011 0535   HCT 40.2 12/22/2011 0535   PLT 310 12/22/2011 0535   MCV 68.3* 12/22/2011 0535    Recent Results (from the past 240 hour(s))  MRSA PCR SCREENING     Status: Normal   Collection Time   12/19/11  2:59 AM      Component Value Range Status Comment   MRSA by PCR NEGATIVE  NEGATIVE  Final     Studies/Results: Dg Chest 2 View  12/20/2011  *RADIOLOGY REPORT*  Clinical Data: Rule out pneumonia.  History of low grade fever with mid chest pain and tightness.   No cough  CHEST - 2 VIEW  Comparison: 12/18/2011, chest x-ray and chest CT  Findings: Low lung volumes are present.  Heart and mediastinal contours are stable.  There are persistent increased markings at both lung bases felt to be most compatible with bibasilar atelectasis given the low lung volumes.  A slight increase in density peripherally at the posterior right lung base is somewhat wedge-shaped and a focus of developing pulmonary infarction or pneumonia is not excluded. No definite pleural fluid is seen.  No signs of congestive failure noted.  Gaseous aeration of visualized bowel loops is seen with no air fluid levels.  Bony structures appear intact.  IMPRESSION: Low lung volumes with increased density at the lung bases most compatible with basilar atelectasis. Question right lower lobe developing pneumonia or area of pulmonary infarction.  Original Report Authenticated By: Bertha Stakes, M.D.    Medications: Scheduled Meds:   . levofloxacin (LEVAQUIN) IV  750 mg Intravenous Q24H  . senna-docusate  1 tablet Oral BID  . warfarin  10 mg Oral ONCE-1800  . DISCONTD: Warfarin - Pharmacist Dosing Inpatient   Does not apply [phone]  Continuous Infusions:   . heparin 1,800 Units/hr (12/22/11 0600)   PRN Meds:.hydrALAZINE, HYDROcodone-acetaminophen  Assessment/Plan:  *Pulmonary emboli bilateral  Patient does have significant hx of blood clots in family members ( his sister, uncle and maternal grandmother)  B/l PE with large right sided central PE with some RV strain noted on CT angio. patient was tachycardic with low grade temp and short of breath on presentation and placed on stepdown  -hypercoagulable w/up sent on admisison shows negative lupus anticoagulant. Negative factor V leiden, protein C, S and Anti thrombin III. b2 glycoprotein and Cardiolipin ab pending  Given large clot burden with SOB and tachycardia patient continued on IV heparin drip for now.  Discussed with Dr Myna Hidalgo who  recommends to continue IV heparin for next 2 days . can be discharged on po xaralto on monday with outpatient follow up. Discontinue coumadin. LE doppler negative for DVT  Transferred  to tele   Rt sided pneumonia  patient had fever with leucocytosis and SOB n 6/6 . CXR suggests possible early rt lower lobe infiltrate vs infarction  started emperiically on levaquin and is now symptomatically improved and afebrile ( day 3)   right sided Chest pain  Likely related to underlying PE and now resolved. Continue prn vicodin    Diet: regular   Full code     LOS: 4 days   [name] [name] 12/22/2011, 9:11 AM

## 2011-12-23 DIAGNOSIS — I10 Essential (primary) hypertension: Secondary | ICD-10-CM

## 2011-12-23 DIAGNOSIS — D696 Thrombocytopenia, unspecified: Secondary | ICD-10-CM

## 2011-12-23 LAB — CBC
HCT: 40.3 % (ref 39.0–52.0)
MCH: 21.9 pg — ABNORMAL LOW (ref 26.0–34.0)
MCV: 68.5 fL — ABNORMAL LOW (ref 78.0–100.0)
RBC: 5.88 MIL/uL — ABNORMAL HIGH (ref 4.22–5.81)
WBC: 7.5 10*3/uL (ref 4.0–10.5)

## 2011-12-23 NOTE — Progress Notes (Signed)
ANTICOAGULATION CONSULT NOTE - Follow Up Consult  Pharmacy Consult for Heparin Indication: pulmonary embolus  No Known Allergies  Patient Measurements: Height: 5\' 9"  (175.3 cm) Weight: 240 lb 9.6 oz (109.135 kg) IBW/kg (Calculated) : 70.7   Vital Signs: Temp: 98.3 F (36.8 C) (06/09 0450) Temp src: Oral (06/09 0450) BP: 137/74 mmHg (06/09 0450) Pulse Rate: 72  (06/09 0450)  Labs:  Basename 12/23/11 0509 12/22/11 0535 12/21/11 1505 12/21/11 0337  HGB 12.9* 13.0 -- --  HCT 40.3 40.2 -- 41.2  PLT 332 310 -- 267  APTT -- -- -- --  LABPROT 25.0* 24.2* -- 16.4*  INR 2.22* 2.13* -- 1.30  HEPARINUNFRC 0.49 0.51 0.39 --  CREATININE -- -- -- --  CKTOTAL -- -- -- --  CKMB -- -- -- --  TROPONINI -- -- -- --    Estimated Creatinine Clearance: 107.5 ml/min (by C-G formula based on Cr of 1.09).   Medications:  Infusions:     . heparin 1,800 Units/hr (12/22/11 2000)    Assessment:  41 yo M with acute bilateral pulmonary emboli started on heparin IV and warfarin on 6/5   Warfarin d/c'd by MD on 6/8 with plan to switch to Xarelto on Monday noted.  Hypercoagulable essentially negative per MD note (although some labs still pending)  CBC stable, WNL  HL therapeutic this am  INR remains therapeutic. No bleeding noted in chart.  Today is technically Day #5 of 5 day minimum warfarin / heparin overlap, with an INR that has now been > 2 for 24 hours.  Plan to change to PO Xarelto noted: would initiate this once INR <3. If INR <3, could start Xarelto at the same time heparin IV is discontinued.  Will order one-time INR for tomorrow am for this anticoag switch  Goal of Therapy:  Heparin Level 0.3-0.7 Monitor platelets by anticoagulation protocol: Yes   Plan:   Continue heparin infusion at 1800 units/h (18 ml/hr)  Daily heparin level  INR x1 tomorrow am to assist with transition to Xarelto (INR must remain <3 to initiate Xarelto)  Darrol Angel, PharmD Pager:  [phone] 12/23/2011 7:33 AM

## 2011-12-23 NOTE — Progress Notes (Signed)
Subjective: No overnight issues  Objective:  Vital signs in last 24 hours:  Filed Vitals:   12/22/11 1422 12/22/11 2208 12/23/11 0450 12/23/11 1326  BP: 129/75 137/82 137/74 144/82  Pulse: 83 71 72 79  Temp: 98 F (36.7 C) 98.3 F (36.8 C) 98.3 F (36.8 C) 98.6 F (37 C)  TempSrc: Oral Oral Oral Oral  Resp: 18 18 18 19   Height:      Weight:   109.135 kg (240 lb 9.6 oz)   SpO2: 94% 96% 93% 95%    Intake/Output from previous day:   Intake/Output Summary (Last 24 hours) at 12/23/11 1356 Last data filed at 12/23/11 0451  Gross per 24 hour  Intake    300 ml  Output    975 ml  Net   -675 ml    Physical Exam:  General: middle aged obese in no acute distress.  HEENT: no pallor, no icterus, moist oral mucosa, no JVD, no lymphadenopathy  Heart: Normal s1 &s2 Regular rate and rhythm, without murmurs, rubs, gallops.  Lungs: Clear to auscultation bilaterally.  Abdomen: Soft, nontender, nondistended, positive bowel sounds.  Extremities: No clubbing cyanosis or edema with positive pedal pulses.  Neuro: Alert, awake, oriented x3, nonfocal.   Lab Results:  Basic Metabolic Panel:    Component Value Date/Time   NA 136 12/19/2011 0610   K 4.1 12/19/2011 0610   CL 100 12/19/2011 0610   CO2 26 12/19/2011 0610   BUN 11 12/19/2011 0610   CREATININE 1.09 12/19/2011 0610   GLUCOSE 125* 12/19/2011 0610   CALCIUM 9.4 12/19/2011 0610   CBC:    Component Value Date/Time   WBC 7.5 12/23/2011 0509   HGB 12.9* 12/23/2011 0509   HCT 40.3 12/23/2011 0509   PLT 332 12/23/2011 0509   MCV 68.5* 12/23/2011 0509    Recent Results (from the past 240 hour(s))  MRSA PCR SCREENING     Status: Normal   Collection Time   12/19/11  2:59 AM      Component Value Range Status Comment   MRSA by PCR NEGATIVE  NEGATIVE  Final   CULTURE, BLOOD (ROUTINE X 2)     Status: Normal (Preliminary result)   Collection Time   12/20/11  7:30 PM      Component Value Range Status Comment   Specimen Description BLOOD RIGHT ARM   Final     Special Requests BOTTLES DRAWN AEROBIC AND ANAEROBIC 10CC   Final    Culture  Setup Time [id number]   Final    Culture     Final    Value:        BLOOD CULTURE RECEIVED NO GROWTH TO DATE CULTURE WILL BE HELD FOR 5 DAYS BEFORE ISSUING A FINAL NEGATIVE REPORT   Report Status PENDING   Incomplete   CULTURE, BLOOD (ROUTINE X 2)     Status: Normal (Preliminary result)   Collection Time   12/20/11  7:35 PM      Component Value Range Status Comment   Specimen Description BLOOD RIGHT ARM   Final    Special Requests BOTTLES DRAWN AEROBIC AND ANAEROBIC 10CC   Final    Culture  Setup Time [id number]   Final    Culture     Final    Value:        BLOOD CULTURE RECEIVED NO GROWTH TO DATE CULTURE WILL BE HELD FOR 5 DAYS BEFORE ISSUING A FINAL NEGATIVE REPORT   Report Status PENDING   Incomplete  Studies/Results: No results found.  Medications: Scheduled Meds:   . levofloxacin (LEVAQUIN) IV  750 mg Intravenous Q24H  . senna-docusate  1 tablet Oral BID   Continuous Infusions:   . heparin 1,800 Units/hr (12/22/11 2000)   PRN Meds:.hydrALAZINE, HYDROcodone-acetaminophen  Assessment/Plan:  Pulmonary emboli bilateral  Patient has significant hx of blood clots in family members ( his sister, uncle and maternal grandmother)  B/l PE with large right sided central PE with some RV strain noted on CT angio. patient was tachycardic with low grade temp and short of breath on presentation and placed on stepdown  -hypercoagulable w/up sent on admisison shows negative lupus anticoagulant. Negative factor V leiden, protein C, S and Anti thrombin III. b2 glycoprotein and Cardiolipin ab pending  -Given large clot burden with SOB and tachycardia patient continued on IV heparin drip for now.  Discussed with Dr Myna Hidalgo who recommends to continue IV heparin  . can be discharged on po xaralto on monday with outpatient follow up. Discontinue coumadin.  LE doppler negative for DVT  Stable on telemetry  Rt  sided pneumonia  patient had fever with leucocytosis and SOB on 6/6 . CXR suggests possible early rt lower lobe infiltrate vs infarction  started emperiically on levaquin and is now symptomatically improved and afebrile ( day 4)   right sided Chest pain  Likely related to underlying PE and now resolved. Continue prn vicodin   Diet: regular   Full code    to be discharged in the morning on oral xaralto   LOS: 5 days   [name] [name] 12/23/2011, 1:56 PM

## 2011-12-24 DIAGNOSIS — R079 Chest pain, unspecified: Secondary | ICD-10-CM

## 2011-12-24 DIAGNOSIS — R091 Pleurisy: Secondary | ICD-10-CM | POA: Diagnosis present

## 2011-12-24 DIAGNOSIS — R7303 Prediabetes: Secondary | ICD-10-CM | POA: Diagnosis present

## 2011-12-24 DIAGNOSIS — D696 Thrombocytopenia, unspecified: Secondary | ICD-10-CM

## 2011-12-24 DIAGNOSIS — J159 Unspecified bacterial pneumonia: Secondary | ICD-10-CM | POA: Diagnosis not present

## 2011-12-24 DIAGNOSIS — I10 Essential (primary) hypertension: Secondary | ICD-10-CM

## 2011-12-24 LAB — CARDIOLIPIN ANTIBODIES, IGG, IGM, IGA
Anticardiolipin IgA: 4 APL U/mL — ABNORMAL LOW (ref <22)
Anticardiolipin IgG: 3 GPL U/mL — ABNORMAL LOW (ref <23)
Anticardiolipin IgM: 3 MPL U/mL — ABNORMAL LOW (ref <11)

## 2011-12-24 LAB — PROTIME-INR: INR: 2.42 — ABNORMAL HIGH (ref 0.00–1.49)

## 2011-12-24 MED ORDER — LEVOFLOXACIN 750 MG PO TABS: 750.0000 mg | ORAL_TABLET | Freq: Every day | ORAL | Status: AC

## 2011-12-24 MED ORDER — RIVAROXABAN 15 MG PO TABS: 15.0000 mg | ORAL_TABLET | Freq: Two times a day (BID) | ORAL | Status: DC

## 2011-12-24 MED ORDER — RIVAROXABAN 20 MG PO TABS: 1.0000 | ORAL_TABLET | Freq: Every day | ORAL | Status: DC

## 2011-12-24 NOTE — Progress Notes (Signed)
ANTICOAGULATION CONSULT NOTE - Follow Up Consult  Pharmacy Consult for Heparin Indication: pulmonary embolus  No Known Allergies  Patient Measurements: Height: 5\' 9"  (175.3 cm) Weight: 240 lb 9.6 oz (109.135 kg) IBW/kg (Calculated) : 70.7   Vital Signs: Temp: 98.4 F (36.9 C) (06/10 0529) Temp src: Oral (06/10 0529) BP: 121/76 mmHg (06/10 0529) Pulse Rate: 75  (06/10 0529)  Labs:  Basename 12/24/11 0430 12/23/11 0509 12/22/11 0535  HGB -- 12.9* 13.0  HCT -- 40.3 40.2  PLT -- 332 310  APTT -- -- --  LABPROT 26.7* 25.0* 24.2*  INR 2.42* 2.22* 2.13*  HEPARINUNFRC 0.66 0.49 0.51  CREATININE -- -- --  CKTOTAL -- -- --  CKMB -- -- --  TROPONINI -- -- --   Estimated Creatinine Clearance: 107.5 ml/min (by C-G formula based on Cr of 1.09).  Medications:  Infusions:     . heparin 1,800 Units/hr (12/24/11 0058)    Assessment:  41 yo M with acute bilateral pulmonary emboli started on heparin IV and warfarin on 6/5   Warfarin d/c'd by MD on 6/8 with plan to switch to Xarelto today.  Hypercoagulable essentially negative per MD note (although some labs still pending)  CBC stable, WNL  HL therapeutic this am.  Patient had completed the minimum overlap 5 day overlap with warfarin with INR >2 for >24 hours.  INR remains therapeutic. No bleeding noted in chart.  Plan to change to PO Xarelto noted.  Since INR <3, could start Xarelto at the same time heparin IV is discontinued.  Goal of Therapy:  Heparin Level 0.3-0.7 Monitor platelets by anticoagulation protocol: Yes   Plan:   Continue heparin infusion at 1800 units/h (18 ml/hr) until discharged with Xarelto as indicated in D/C summary.  Clance Boll, PharmD, BCPS Pager: [phone] 12/24/2011 8:12 AM

## 2011-12-24 NOTE — Discharge Instructions (Signed)
Pulmonary Embolus A pulmonary (lung) embolus (PE) is a blood clot that has traveled from another place in the body to the lung. Most clots come from deep veins in the legs or pelvis. PE is a dangerous and potentially life-threatening condition that can be treated if identified. CAUSES Blood clots form in a vein for different reasons. Usually several things cause blood clots. They include:  The flow of blood slows down.   The inside of the vein is damaged in some way.   The person has a condition that makes the blood clot more easily. These conditions may include:   Older age (especially over 75 years old).   Having a history of blood clots.   Having major or lengthy surgery. Hip surgery is particularly high-risk.   Breaking a hip or leg.   Sitting or lying still for a long time.   Cancer or cancer treatment.   Having a long, thin tube (catheter) placed inside a vein during a medical procedure.   Being overweight (obese).   Pregnancy and childbirth.   Medicines with estrogen.   Smoking.   Other circulation or heart problems.  SYMPTOMS  The symptoms of a PE usually start suddenly and include:  Shortness of breath.   Coughing.   Coughing up blood or blood-tinged mucus (phlegm).   Chest pain. Pain is often worse with deep breaths.   Rapid heartbeat.  DIAGNOSIS  If a PE is suspected, your caregiver will take a medical history and carry out a physical exam. Your caregiver will check for the risk factors listed above. Tests that also may be required include:  Blood tests, including studies of the clotting properties of your blood.   Imaging tests. Ultrasound, CT, MRI, and other tests can all be used to see if you have clots in your legs or lungs. If you have a clot in your legs and have breathing or chest problems, your caregiver may conclude that you have a clot in your lungs. Further lung tests may not be needed.   An EKG can look for heart strain from blood clots in  the lungs.  PREVENTION   Exercise the legs regularly. Take a brisk 30 minute walk every day.   Maintain a weight that is appropriate for your height.   Avoid sitting or lying in bed for long periods of time without moving your legs.   Women, particularly those over the age of 35, should consider the risks and benefits of taking estrogen medicines, including birth control pills.   Do not smoke, especially if you take estrogen medicines.   Long-distance travel can increase your risk. You should exercise your legs by walking or pumping the muscles every hour.   In hospital prevention:   Your caregiver will assess your need for preventive PE care (prophylaxis) when you are admitted to the hospital. If you are having surgery, your surgeon will assess you the day of or day after surgery.   Prevention may include medical and nonmedical measures.  TREATMENT   The most common treatment for a PE is blood thinning (anticoagulant) medicine, which reduces the blood's tendency to clot. Anticoagulants can stop new blood clots from forming and old ones from growing. They cannot dissolve existing clots. Your body does this by itself over time. Anticoagulants can be given by mouth, by intravenous (IV) access, or by injection. Your caregiver will determine the best program for you.   Less commonly, clot-dissolving drugs (thrombolytics) are used to dissolve a PE. They   carry a high risk of bleeding, so they are used mainly in severe cases.   Very rarely, a blood clot in the leg needs to be removed surgically.   If you are unable to take anticoagulants, your caregiver may arrange for you to have a filter placed in a main vein in your belly (abdomen). This filter prevents clots from traveling to your lungs.  HOME CARE INSTRUCTIONS   Take all medicines prescribed by your caregiver. Follow the directions carefully.   You will most likely continue taking anticoagulants after you leave the hospital. Your  caregiver will advise you on the length of treatment (usually 3 to 6 months, sometimes for life).   Taking too much or too little of an anticoagulant is dangerous. While taking this type of medicine, you will need to have regular blood tests to be sure the dose is correct. The dose can change for many reasons. It is critically important that you take this medicine exactly as prescribed and that you have blood tests exactly as directed.   Many foods can interfere with anticoagulants. These include foods high in vitamin K, such as spinach, kale, broccoli, cabbage, collard and turnip greens, Brussels sprouts, peas, cauliflower, seaweed, parsley, beef and pork liver, green tea, and soybean oil. Your caregiver should discuss limits on these foods with you or you should arrange a visit with a dietician to answer your questions.   Many medicines can interfere with anticoagulants. You must tell your caregiver about any and all medicines you take. This includesall vitamins and supplements. Be especially cautious with aspirin and anti-inflammatory medicines. Ask your caregiver before taking these.   Anticoagulants can have side effects, mostly excessive bruising or bleeding. You will need to hold pressure over cuts for longer than usual. Avoid alcoholic drinks or consume only very small amounts while taking this medicine.   If you are taking an anticoagulant:   Wear a medical alert bracelet.   Notify your dentist or other caregivers before procedures.   Avoid contact sports.   Ask your caregiver how soon you can go back to normal activities. Not being active can lead to new clots. Ask for a list of what you should and should not do.   Exercise your lower leg muscles. This is important while traveling.   You may need to wear compression stockings. These are tight elastic stockings that apply pressure to the lower legs. This can help keep the blood in the legs from clotting.   If you are a smoker, you  should quit.   Learn as much as you can about pulmonary embolisms.  SEEK MEDICAL CARE IF:   You notice a rapid heartbeat.   You feel weaker or more tired than usual.   You feel faint.   You notice increased bruising.   Your symptoms are not getting better in the time expected.   You are having side effects of medicine.   You have an oral temperature above 102 F (38.9 C).   You discover other family members with blood clots. This may require further testing for inherited diseases or conditions.  SEEK IMMEDIATE MEDICAL CARE IF:   You have chest pain.   You have trouble breathing.   You have new or increased swelling or pain in one leg.   You cough up blood.   You notice blood in vomit, in a bowel movement, or in urine.   You have an oral temperature above 102 F (38.9 C), not controlled by medicine.    You may have another PE. A blood clot in the lungs is a medical emergency. Call your local emergency services (911 in [place] to get to the nearest hospital or clinic. Do not drive yourself. MAKE SURE YOU:   Understand these instructions.   Will watch your condition.   Will get help right away if you are not doing well or get worse.  Document Released: 06/29/2000 Document Revised: 06/21/2011 Document Reviewed: 01/03/2009 [employer] Patient Information 2012 [employer]. 

## 2011-12-24 NOTE — Consult Note (Signed)
[name], [name]              ACCOUNT NO.:  [id number]  MEDICAL RECORD NO.:  [id number]  LOCATION:                               FACILITY:  [facility]  PHYSICIAN:  Josph Macho, M.D.  DATE OF BIRTH:  04-11-1971  DATE OF CONSULTATION: DATE OF DISCHARGE:                                CONSULTATION   DIAGNOSIS:  Bilateral pulmonary embolism-likely idiopathic.  HISTORY OF PRESENT ILLNESS:  [name] [name] is a really nice 41 year old African gentleman.  He really has no past medical history.  He does have some __________ hyperlipidemia.  He works loading bricks on to transport trucks.  He has been active.  He works out.  He does not do any type of unusual supplements.  The day prior to admission, he just woke up and was found to have some chest pain and some shortness of breath.  This was right-sided chest wall pain.  He denied any diaphoresis.  He had no jaw pain.  He had no hemoptysis.  He actually went to work that day.  His symptoms continued.  He subsequently presented to the emergency room.  He had a CT angiogram done.  Not surprisingly, this did show a large central pulmonary embolism involving the right pulmonary artery.  Also noted a small left- sided pulmonary embolus.  There is no evidence of a pulmonary infarction.  No adenopathy is noted in the mediastinum.  Heart size looked okay.  I think he did undergo Doppler's of his legs on the 5th.  He was admitted on the 4th.  Doppler studies were negative for DVT.  We started on heparin.  He has had coagulation studies done.  A thrombophilic panel was done. He had negative lupus anticoagulant.  He had normal antithrombin III level.  He was negative for factor V Leiden mutation.  His protein C and protein S levels were normal.  Other hypercoagulable studies I think are pending.  He is feeling better.  He has had no fever.  He had no weight loss or weight gain.  He is not a vegetarian.  He has had no change in bowel or  bladder habits.  He has had no rashes.  There has been no joint problems.  We were asked to see him to try to help with management with respect to long-term anticoagulation.  PAST MEDICAL HISTORY:  His past medical history is remarkable for hyperlipidemia.  ALLERGIES:  None.  ADMISSION MEDICATIONS: 1. Prilosec 20 mg p.o. q. daily. 2. Lovaza daily.  SOCIAL HISTORY:  Remarkable for past tobacco use.  He stopped 7 years ago.  There is occasional alcohol use.  FAMILY HISTORY:  Remarkable for sister who had a pulmonary embolism. There is also an uncle who may have had pulmonary embolism.  There is history of prostate cancer in a paternal grandfather.  His mother died of lung cancer.  She was a smoker.  REVIEW OF SYSTEMS:  His review of systems is as stated in history of present illness.  No additional findings noted on 12-system review.  PHYSICAL EXAMINATION:  GENERAL:  This is a well-developed, well- nourished, black gentleman.  He was quite stout. VITAL SIGNS:  Show a temperature  of 99.4, pulse 86, respiratory rate 18, blood pressure 170/84, oxygen saturation is 94% room air. HEAD AND NECK EXAM:  Shows a normocephalic, atraumatic skull.  There are no ocular or oral lesions.  There are no palpable cervical or supraclavicular lymph nodes. LUNGS:  Clear bilaterally. CARDIAC EXAM:  Regular rate and rhythm with normal S1, S2.  There are no murmurs, rubs, or bruits. ABDOMINAL EXAM:  Soft with good bowel sounds.  There is no palpable abdominal mass.  There is no fluid wave.  There is no palpable hepatosplenomegaly. BACK EXAM:  No tenderness of the spine, ribs, or hips. EXTREMITIES:  Shows no clubbing, cyanosis, or edema.  He has good range motion of his joints. SKIN EXAM:  No rashes, ecchymosis, or petechia. NEUROLOGICAL EXAM:  Shows no focal neurological deficits.  LABORATORY STUDIES:  Show white cell count 9.3, hemoglobin 13, hematocrit 41, platelet count is 267.  MCV is  69.  IMPRESSION:  [name] [name] is a 41 year old gentleman with pulmonary embolism.  This appears to be idiopathic.  I suppose other hypercoagulable studies will come back later.  I would think, however, that these would be negative.  He, in my mind, needs 2 years anticoagulation.  I believe with this idiopathic pulmonary embolism that is significant, __________, I would feel comfortable with 2 years of therapeutic anticoagulation.  I believe he will be a great candidate for Xarelto.  However, I would to keep him on heparin full right now.  I will keep him on heparin through the weekend.  I just believe that heparin is a very good anticoagulant.  I believe this would be a great way of decreasing his clot burden acutely.  I would then get him onto Xarelto 50 mg p.o. b.i.d. as an outpatient.  I keep him on Xarelto 15 mg p.o. b.i.d. for 3 weeks and then get him on 20 mg a day, again, for 2 years.  I do not believe that we need to go on a "tumor hunt."  I do not see anything on his physical exam or in his history that would suggest an underlying malignancy.  We will await the additional hypercoagulable studies that have not yet come back.  I spent a good hour so with [name] [name].  He is a real nice guy.  I answered all his questions.  We will be more happy to follow him as an outpatient once he is discharged.  Again, I would keep him on heparin over the weekend.  I would then move him over to Xarelto and then he can be discharged with subsequent outpatient followup.     Josph Macho, M.D.     PRE/MEDQ  D:  12/21/2011  T:  12/22/2011  Job:  [id number]  cc:   Duane Lope

## 2011-12-24 NOTE — Discharge Summary (Signed)
Patient ID: [name] [name] MRN: [id number] DOB/AGE: Feb 13, 1971 41 y.o.  Admit date: 12/18/2011 Discharge date: 12/24/2011  Primary Care Physician:  Daisy Floro, MD, MD  Discharge Diagnoses:     Principal Problem:  *Pulmonary emboli bilateral  Active Problems: - Community acquired bacterial pneumonia  -Pleurisy -pre diabetes   Medication List  As of 12/24/2011  7:59 AM   STOP taking these medications         PRESCRIPTION MEDICATION         TAKE these medications         fish oil-omega-3 fatty acids 1000 MG capsule   Take 2 g by mouth daily.      levofloxacin 750 MG tablet   Commonly known as: LEVAQUIN   Take 1 tablet (750 mg total) by mouth daily.      multivitamin with minerals Tabs   Take 1 tablet by mouth daily.      omeprazole 20 MG capsule   Commonly known as: PRILOSEC   Take 20 mg by mouth daily as needed. For indigestion.      Rivaroxaban 15 MG Tabs tablet   Commonly known as: XARELTO   Take 1 tablet (15 mg total) by mouth 2 (two) times daily.      Rivaroxaban 20 MG Tabs   Take 1 tablet by mouth daily.   Start taking on: 01/15/2012      vitamin C 500 MG tablet   Commonly known as: ASCORBIC ACID   Take 500 mg by mouth daily.            Disposition and Follow-up:  Home with outpatient follow up with Dr Myna Hidalgo in 4 weeks  Consults:   Christin Bach ( heme)  Significant Diagnostic Studies:  Dg Chest 2 View  12/18/2011  *RADIOLOGY REPORT*  Clinical Data: Chest and right-sided back pain.  Shortness of breath.  CHEST - 2 VIEW  Comparison: No priors.  Findings: Lung volumes are low.  There are bibasilar opacities which are linear in appearance, favored to represent areas of atelectasis.  Underlying air space consolidation is difficult to its entirely exclude, but is not strongly favored.  There is minimal blunting of the costophrenic sulci which may be secondary to the low lung volumes, however, small bilateral pleural effusions would be difficult to  exclude.  Pulmonary vasculature is normal. Cardiomediastinal silhouette is within normal limits.  IMPRESSION: 1.  Low lung volumes with bibasilar opacities are favored to represent areas of subsegmental atelectasis, however, underlying airspace consolidation from aspiration or infection is difficult to exclude.  There may also be small bilateral pleural effusions.  Original Report Authenticated By: Florencia Reasons, M.D.   Ct Angio Chest W/cm &/or Wo Cm  12/18/2011  *RADIOLOGY REPORT*  Clinical Data: Chest pain.  Elevated D-dimer.  CT ANGIOGRAPHY CHEST  Technique:  Multidetector CT imaging of the chest using the standard protocol during bolus administration of intravenous contrast. Multiplanar reconstructed images including MIPs were obtained and reviewed to evaluate the vascular anatomy.  Contrast: OMNIPAQUE IOHEXOL 300 MG/ML  SOLN  Comparison: None  Findings: The chest wall is unremarkable.  No supraclavicular or axillary adenopathy.  The thyroid gland is normal.  The bony thorax is intact.  The thoracic vertebral bodies are normally aligned.  The heart is upper limits of normal in size.  No pericardial effusion.  No mediastinal or hilar lymphadenopathy.  The aorta is normal in caliber.  No dissection.  The esophagus is grossly normal.  The pulmonary arterial tree  is fairly well opacified.  There is a large central pulmonary embolus involving the right pulmonary artery and lower lobe branch vessels.  Small left-sided pulmonary emboli are also suspected.  Examination of the lung parenchyma demonstrates vascular congestion and bibasilar atelectasis.  Vague ground-glass opacity could reflect mild edema.  No definite pleural effusions.  The upper abdomen is unremarkable.  IMPRESSION:  1.  Bilateral pulmonary emboli with a large central embolus on the right.  Right ventricular strain pattern noted. 2.  Vascular congestion, mild edema and areas of atelectasis. 3.  Normal thoracic aorta.  Critical  Value/emergent results were called by telephone at the time of interpretation to  Dr. Linwood Dibbles, who verbally acknowledged these results.  Original Report Authenticated By: P. Loralie Champagne, M.D.    Brief H and P: For complete details please refer to admission H and P, but in brief 41 year old male who approximately 2 AM yesterday morning had sudden right-sided chest pain and shortness of breath that woke him up from sleep. He denies any recent traveling, reason lower extremity edema or swelling or pain in his calf , trauma. He has not had any recent surgeries or immunization. He denies any recent illnesses, no fevers, rashes, nausea, vomiting, or diarrhea. He has one sister who had a pulmonary emboli last year attributed to estrogen use and a maternal grandmother also had a pulmonary emboli in her 62s. He has never had a blood clot before. CT scan of his chest shows significant pulmonary emboli bilaterally.    Physical Exam on Discharge:  Filed Vitals:   12/23/11 0450 12/23/11 1326 12/23/11 2150 12/24/11 0529  BP: 137/74 144/82 133/81 121/76  Pulse: 72 79 75 75  Temp: 98.3 F (36.8 C) 98.6 F (37 C) 98.7 F (37.1 C) 98.4 F (36.9 C)  TempSrc: Oral Oral Oral Oral  Resp: 18 19 20 18   Height:      Weight: 109.135 kg (240 lb 9.6 oz)     SpO2: 93% 95% 94% 94%     Intake/Output Summary (Last 24 hours) at 12/24/11 0759 Last data filed at 12/24/11 0531  Gross per 24 hour  Intake      0 ml  Output   1425 ml  Net  -1425 ml    General: Alert, awake, oriented x3, in no acute distress. HEENT: no pallor, MMM Heart: Regular rate and rhythm, without murmurs, rubs, gallops. Lungs: Clear to auscultation bilaterally. Abdomen: Soft, nontender, nondistended, positive bowel sounds. Extremities: No clubbing cyanosis or edema with positive pedal pulses. Neuro: Grossly intact, nonfocal.  CBC:    Component Value Date/Time   WBC 7.5 12/23/2011 0509   HGB 12.9* 12/23/2011 0509   HCT 40.3 12/23/2011  0509   PLT 332 12/23/2011 0509   MCV 68.5* 12/23/2011 0509    Basic Metabolic Panel:    Component Value Date/Time   NA 136 12/19/2011 0610   K 4.1 12/19/2011 0610   CL 100 12/19/2011 0610   CO2 26 12/19/2011 0610   BUN 11 12/19/2011 0610   CREATININE 1.09 12/19/2011 0610   GLUCOSE 125* 12/19/2011 0610   CALCIUM 9.4 12/19/2011 0610    Hospital Course:  Pulmonary emboli bilateral  -Bilateral  PE with large right sided central PE with some RV strain noted on CT angio. patient was tachycardic with low grade temp and short of breath on presentation and placed on stepdown  -hypercoagulable w/up sent on admisison shows negative lupus anticoagulant. Negative factor V leiden, protein C, S and Anti thrombin  III. b2 glycoprotein and Cardiolipin ab pending - Patient does have significant hx of blood clots in family members ( his sister, uncle and maternal grandmother)    Given large clot burden with SOB and tachycardia patient continued on IV heparin drip  Discussed with Dr Myna Hidalgo who recommends to continue IV heparin during hospital stay  ( received for  6 days) and  can be discharged on po xaralto  with outpatient follow up.  LE doppler negative for DVT  Dr Myna Hidalgo will determine the duration of anticoagulation and will follow pt in the clinic.  Rt sided pneumonia  patient had fever with leucocytosis and SOB on 6/6 . CXR suggests possible early rt lower lobe infiltrate vs infarction . He was started on levofloxacin with resolution of his symptoms. Will complete a 7 day course.   right sided Chest pain ( pleurisy) Likely related to underlying PE and now resolved.    -Prediabetes ( based on A1C of 6.1)  patient noted to have a Hb A1C of 6.1 which puts him at 25-50% risk of having DM in next 5 years. Changes in lifestyle, including diet modification, weight loss, and exercise slow progression of impaired glucose tolerance to overt diabetes and this was discussed with patient . He should periodically follow up  with his PCP regarding this as outpatient.    Patient clinically stable to be discharged home with out ptatient follow up. He was explained the need to be on  The oral anticoagulant and to be compliant with it. All potential side effects and risk of the medications were discussed in detail.     Time spent on Discharge: 45 MINUTES  Signed: Eddie North 12/24/2011, 7:59 AM

## 2011-12-27 LAB — CULTURE, BLOOD (ROUTINE X 2)
Culture  Setup Time: 201306070138
Culture: NO GROWTH

## 2011-12-28 ENCOUNTER — Telehealth: Payer: Self-pay | Admitting: Hematology & Oncology

## 2011-12-28 NOTE — Telephone Encounter (Signed)
Pt called made 01-18-12 appointment for hospital follow up

## 2012-01-18 ENCOUNTER — Other Ambulatory Visit (HOSPITAL_BASED_OUTPATIENT_CLINIC_OR_DEPARTMENT_OTHER): Payer: BC Managed Care – PPO | Admitting: Lab

## 2012-01-18 ENCOUNTER — Ambulatory Visit (HOSPITAL_BASED_OUTPATIENT_CLINIC_OR_DEPARTMENT_OTHER): Payer: BC Managed Care – PPO | Admitting: Hematology & Oncology

## 2012-01-18 ENCOUNTER — Ambulatory Visit: Payer: BC Managed Care – PPO

## 2012-01-18 VITALS — BP 130/80 | HR 69 | Temp 97.2°F | Ht 69.0 in | Wt 247.0 lb

## 2012-01-18 DIAGNOSIS — I2699 Other pulmonary embolism without acute cor pulmonale: Secondary | ICD-10-CM

## 2012-01-18 LAB — CBC WITH DIFFERENTIAL (CANCER CENTER ONLY)
BASO#: 0 10*3/uL (ref 0.0–0.2)
Eosinophils Absolute: 0.1 10*3/uL (ref 0.0–0.5)
HGB: 13.1 g/dL (ref 13.0–17.1)
LYMPH#: 2.4 10*3/uL (ref 0.9–3.3)
MONO#: 0.4 10*3/uL (ref 0.1–0.9)
NEUT#: 1.7 10*3/uL (ref 1.5–6.5)
RBC: 5.99 10*6/uL — ABNORMAL HIGH (ref 4.20–5.70)

## 2012-01-18 NOTE — Progress Notes (Signed)
This office note has been dictated.

## 2012-01-19 NOTE — Progress Notes (Signed)
CC:   Magnus Sinning) Tenny Craw, M.D.  DIAGNOSIS:  Bilateral pulmonary embolism, idiopathic.  CURRENT THERAPY:  Xarelto 20 mg p.o. daily.  INTERIM HISTORY:  This is Mr. Niederer's first office visit.  I saw him over at [facility] about a month or so ago.  At that point in time, he basically presented with idiopathic pulmonary emboli.  He underwent hypercoagulable studies.  All of his studies were unremarkable.  There is no evidence of any thrombophilic state with the tests that were done.  I felt that he would do okay on outpatient Xarelto.  He is working.  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 He is doing well with this.  He has had no chest pain.  There is no cough.  He has had no bleeding. He has had no change in bowel or bladder habits.  PHYSICAL EXAMINATION:  This is a well-developed, well-nourished black gentleman in no obvious distress.  Vital signs: 97.2, pulse 69, respiratory rate 20, blood pressure with 130/80.  Weight is 247.  Head and neck:  Normocephalic, atraumatic skull.  There are no ocular or oral lesions.  There are no palpable cervical or supraclavicular lymph nodes. Lungs:  Clear bilaterally.  Cardiac:  Regular rate and rhythm with a normal S1, S2.  There are no murmurs, rubs or bruits.  Abdomen:  Soft with good bowel sounds.  He is quite large.  He has no fluid wave. There is no palpable hepatosplenomegaly.  Back:  No tenderness over the spine, ribs, or hips.  Extremities:  No clubbing, cyanosis or edema.  LABORATORY STUDIES:  White cell count 4.5, hemoglobin 13.1, hematocrit 41, platelet count 231.  IMPRESSION:  Mr. Minion is a 41 year old black gentleman with pulmonary emboli.  These truly appear to be idiopathic.  It is hard to say why these developed.  He clearly needs to be on prolonged anticoagulation.  I feel that 2 years would be appropriate for him.  He started this back, I think, in early June.  I would like to repeat a  CT scan of his chest in about 3-4 months.  I want to make sure that everything is resolving with respect to his emboli.  While in the hospital, he did have a Doppler of his legs.  Dopplers were negative for any evidence of DVT.  Again, 2 years of anticoagulation would be appropriate for him.  After that, then we will put him on aspirin.  Again, I will see him back myself in September or so.  We will get a CT angio of the chest before we see him.    ______________________________ Josph Macho, M.D. PRE/MEDQ  D:  01/18/2012  T:  01/19/2012  Job:  [id number]

## 2012-01-21 LAB — D-DIMER, QUANTITATIVE: D-Dimer, Quant: 0.32 ug/mL-FEU (ref 0.00–0.48)

## 2012-03-28 ENCOUNTER — Ambulatory Visit (HOSPITAL_BASED_OUTPATIENT_CLINIC_OR_DEPARTMENT_OTHER)
Admission: RE | Admit: 2012-03-28 | Discharge: 2012-03-28 | Disposition: A | Discharge status: Home / Self Care | Payer: BC Managed Care – PPO | Source: Ambulatory Visit | Attending: Hematology & Oncology | Admitting: Hematology & Oncology

## 2012-03-28 DIAGNOSIS — Z7901 Long term (current) use of anticoagulants: Secondary | ICD-10-CM | POA: Insufficient documentation

## 2012-03-28 DIAGNOSIS — I2699 Other pulmonary embolism without acute cor pulmonale: Secondary | ICD-10-CM

## 2012-03-28 DIAGNOSIS — J984 Other disorders of lung: Secondary | ICD-10-CM | POA: Insufficient documentation

## 2012-03-28 MED ORDER — IOHEXOL 350 MG/ML SOLN
80.0000 mL | Freq: Once | INTRAVENOUS | Status: AC | PRN
  Administered 2012-03-28: 80 mL via INTRAVENOUS

## 2012-04-03 ENCOUNTER — Telehealth: Payer: Self-pay | Admitting: *Deleted

## 2012-04-03 NOTE — Telephone Encounter (Signed)
Called patient and left message on personal phone to let him know that his scan showed no blood clots in the lungs per dr. Myna Hidalgo

## 2012-04-03 NOTE — Telephone Encounter (Signed)
Message copied by Anselm Jungling on Thu Apr 03, 2012 12:18 PM ------      Message from: Josph Macho      Created: Tue Apr 01, 2012  7:47 AM       Call - No blood clots in the lungs!!  Cindee Lame

## 2012-04-04 ENCOUNTER — Other Ambulatory Visit (HOSPITAL_BASED_OUTPATIENT_CLINIC_OR_DEPARTMENT_OTHER): Payer: BC Managed Care – PPO | Admitting: Lab

## 2012-04-04 ENCOUNTER — Ambulatory Visit (HOSPITAL_BASED_OUTPATIENT_CLINIC_OR_DEPARTMENT_OTHER): Payer: BC Managed Care – PPO | Admitting: Hematology & Oncology

## 2012-04-04 VITALS — BP 143/84 | HR 76 | Temp 98.2°F | Resp 20 | Ht 69.0 in | Wt 229.0 lb

## 2012-04-04 DIAGNOSIS — I2699 Other pulmonary embolism without acute cor pulmonale: Secondary | ICD-10-CM

## 2012-04-04 NOTE — Progress Notes (Signed)
This office note has been dictated.

## 2012-04-05 NOTE — Progress Notes (Signed)
CC:   Magnus Sinning) Tenny Craw, M.D.  DIAGNOSIS:  Bilateral pulmonary embolism, idiopathic.  CURRENT THERAPY:  Xarelto 20 mg p.o. daily, 2-year therapy.  INTERIM HISTORY:  Mr. Mayabb comes in for his followup.  We did go and do ahead and do a CT scan of his chest.  This was done I think back on September 19th.  There was no evidence of pulmonary emboli in his lungs.  He is feeling well.  He has had no chest pain.  He has had no cough. There is no hemoptysis.  He is working without difficulties.  He has had no fevers, sweats or chills.  He has had no change in bowel or bladder habits.  He is losing weight, which he wants to do.  He has had no leg swelling.  There has been no leg pain.  PHYSICAL EXAMINATION:  This is a well developed, well-nourished black gentleman in no obvious distress.  Vital signs:  Temperature 98.2, pulse 76, respiratory rate 20, blood pressure 143/84.  Weight is 229.  Head and neck:  Normocephalic, atraumatic skull.  There are no ocular or oral lesions.  There are no palpable cervical or supraclavicular lymph nodes. Lungs:  Clear bilaterally.  Cardiac:  Regular rate and rhythm with a normal S1 and S2.  There are no murmurs, rubs, or bruits.  Abdomen: Soft with good bowel sounds.  There is no palpable abdominal mass. There is no palpable hepatosplenomegaly.  Back:  No tenderness over the spine, ribs, or hips.  Extremities:  No clubbing, cyanosis or edema. There is no palpable venous cord in his legs.  Neurologic:  No focal neurological deficits.  LABORATORY STUDIES:  Pending.  IMPRESSION:  Mr. Minish is a 41 year old gentleman with idiopathic pulmonary emboli.  He presented in July 2013.  I have him on Xarelto.  I think he needs Xarelto for 2 years.  We will get him back in 6 months' time now.  I think this would be a reasonable period.  He is doing well.  His last CT scan looked great.  I told him to give [place] a call if he has any  problems.   ______________________________ Josph Macho, M.D. PRE/MEDQ  D:  04/04/2012  T:  04/05/2012  Job:  [id number]

## 2012-04-07 ENCOUNTER — Telehealth: Payer: Self-pay | Admitting: Hematology & Oncology

## 2012-04-07 NOTE — Telephone Encounter (Signed)
Mailed 3-21 schedule

## 2012-09-22 ENCOUNTER — Telehealth: Payer: Self-pay | Admitting: Hematology & Oncology

## 2012-09-22 NOTE — Telephone Encounter (Signed)
Pt moved 3-28 to 4-7

## 2012-10-03 ENCOUNTER — Ambulatory Visit: Payer: BC Managed Care – PPO | Admitting: Hematology & Oncology

## 2012-10-03 ENCOUNTER — Other Ambulatory Visit: Payer: BC Managed Care – PPO | Admitting: Lab

## 2012-10-10 ENCOUNTER — Other Ambulatory Visit: Payer: BC Managed Care – PPO | Admitting: Lab

## 2012-10-10 ENCOUNTER — Ambulatory Visit: Payer: BC Managed Care – PPO | Admitting: Hematology & Oncology

## 2012-10-20 ENCOUNTER — Ambulatory Visit (HOSPITAL_BASED_OUTPATIENT_CLINIC_OR_DEPARTMENT_OTHER): Payer: BC Managed Care – PPO | Admitting: Hematology & Oncology

## 2012-10-20 ENCOUNTER — Other Ambulatory Visit (HOSPITAL_BASED_OUTPATIENT_CLINIC_OR_DEPARTMENT_OTHER): Payer: BC Managed Care – PPO | Admitting: Lab

## 2012-10-20 VITALS — BP 133/85 | HR 66 | Temp 97.9°F | Resp 18 | Ht 69.0 in | Wt 242.0 lb

## 2012-10-20 DIAGNOSIS — I2699 Other pulmonary embolism without acute cor pulmonale: Secondary | ICD-10-CM

## 2012-10-20 DIAGNOSIS — D649 Anemia, unspecified: Secondary | ICD-10-CM

## 2012-10-20 DIAGNOSIS — Z7901 Long term (current) use of anticoagulants: Secondary | ICD-10-CM

## 2012-10-20 LAB — CBC WITH DIFFERENTIAL (CANCER CENTER ONLY)
BASO#: 0 10*3/uL (ref 0.0–0.2)
BASO%: 0.4 % (ref 0.0–2.0)
HCT: 43.1 % (ref 38.7–49.9)
HGB: 14.1 g/dL (ref 13.0–17.1)
LYMPH#: 2.7 10*3/uL (ref 0.9–3.3)
MONO#: 0.4 10*3/uL (ref 0.1–0.9)
NEUT%: 37.5 % — ABNORMAL LOW (ref 40.0–80.0)
RBC: 6.37 10*6/uL — ABNORMAL HIGH (ref 4.20–5.70)
RDW: 18 % — ABNORMAL HIGH (ref 11.1–15.7)
WBC: 5.2 10*3/uL (ref 4.0–10.0)

## 2012-10-20 LAB — D-DIMER, QUANTITATIVE: D-Dimer, Quant: 0.34 ug/mL-FEU (ref 0.00–0.48)

## 2012-10-20 NOTE — Progress Notes (Signed)
This office note has been dictated.

## 2012-10-21 NOTE — Progress Notes (Signed)
CC:   [name] [name]) Tenny Craw, M.D.  DIAGNOSIS:  Bilateral pulmonary emboli, idiopathic.  CURRENT THERAPY:  Xarelto 20 mg p.o. daily (2 years total to be given).  INTERIM HISTORY:  [name] [name] comes in for followup.  He is doing well. He did fracture a I think toe on his left foot.  He did not need any surgery for this.  There is no cast for it.  He has been getting around okay.  Thankfully, there has been no problem with bleeding.  He is really not having to go get his blood checked with Coumadin.  He has had no cough or shortness of breath.  There has been no chest wall pain.  He has had no nausea or vomiting.  There has been no abdominal discomfort.  PHYSICAL EXAMINATION:  General:  This is a well-developed, well- nourished African American gentleman in no obvious distress.  Vital signs:  Show a temperature of 97.9, pulse 66, respiratory rate 18, blood pressure 133/85.  Weight is 242.  Head and neck:  Shows a normocephalic, atraumatic skull.  There are no ocular or oral lesions.  There are no palpable cervical or supraclavicular lymph nodes.  Lungs:  Clear to percussion and auscultation bilaterally.  Cardiac:  Regular rate and rhythm with a normal S1, S2.  There are no murmurs, rubs or bruits. Abdomen:  Soft with good bowel sounds.  There is no palpable abdominal mass.  There is no fluid wave.  There is no palpable hepatosplenomegaly. Extremities:  Show no clubbing, cyanosis or edema.  Neurological:  Shows no focal neurological deficits.  LABORATORY STUDIES:  White cell count is 5.2, hemoglobin 14.1, hematocrit 43.1, platelet count 259.  IMPRESSION:  [name] [name] is a 42 year old African American gentleman with idiopathic pulmonary emboli.  We checked him for hypercoagulable issues and everything came back negative.  Again 2 years of Xarelto would be my approach.  He will finish up in July of 2015.  We will get him back in 6 more months.  I do not see a need for any scans or  Dopplers.    ______________________________ Josph Macho, M.D. PRE/MEDQ  D:  10/20/2012  T:  10/21/2012  Job:  [id number]

## 2013-04-20 ENCOUNTER — Other Ambulatory Visit (HOSPITAL_BASED_OUTPATIENT_CLINIC_OR_DEPARTMENT_OTHER): Payer: BC Managed Care – PPO | Admitting: Lab

## 2013-04-20 ENCOUNTER — Ambulatory Visit (HOSPITAL_BASED_OUTPATIENT_CLINIC_OR_DEPARTMENT_OTHER): Payer: BC Managed Care – PPO | Admitting: Hematology & Oncology

## 2013-04-20 VITALS — BP 143/80 | HR 55 | Temp 98.0°F | Resp 18 | Ht 69.0 in | Wt 238.0 lb

## 2013-04-20 DIAGNOSIS — Z86711 Personal history of pulmonary embolism: Secondary | ICD-10-CM

## 2013-04-20 DIAGNOSIS — I2699 Other pulmonary embolism without acute cor pulmonale: Secondary | ICD-10-CM

## 2013-04-20 DIAGNOSIS — Z7901 Long term (current) use of anticoagulants: Secondary | ICD-10-CM

## 2013-04-20 DIAGNOSIS — D649 Anemia, unspecified: Secondary | ICD-10-CM

## 2013-04-20 LAB — CBC WITH DIFFERENTIAL (CANCER CENTER ONLY)
BASO#: 0 10*3/uL (ref 0.0–0.2)
Eosinophils Absolute: 0.1 10*3/uL (ref 0.0–0.5)
HGB: 14.1 g/dL (ref 13.0–17.1)
LYMPH#: 2.5 10*3/uL (ref 0.9–3.3)
MCH: 22.2 pg — ABNORMAL LOW (ref 28.0–33.4)
MONO%: 10.8 % (ref 0.0–13.0)
NEUT#: 1.5 10*3/uL (ref 1.5–6.5)
RBC: 6.35 10*6/uL — ABNORMAL HIGH (ref 4.20–5.70)

## 2013-04-20 NOTE — Progress Notes (Signed)
This office note has been dictated.

## 2013-04-21 NOTE — Progress Notes (Signed)
CC:   Magnus Sinning) Tenny Craw, M.D.  DIAGNOSIS:  Bilateral pulmonary emboli -- idiopathic.  CURRENT THERAPY:  Xarelto 20 mg p.o. q. day -- to end July 2015.  INTERIM HISTORY:  Mr. Latour comes in for followup.  He is doing well. He got through the fracture of the, I think, left foot.  I think the small toe of his left foot got fractured.  This did not stop him from working.  He is still working.  Of note, his last CT scan which was done back a year ago in September did not show any residual pulmonary emboli.  His last D-dimer was 0.34 back in April.  He has had no problems with cough or shortness breath.  He has had no bleeding.  There is no change in bowel or bladder habits.  He has had no leg swelling.  He has had no "cramps" in his legs.  He has had no rashes.  He has had no fevers, sweats, or chills.  He has had no change in medications.  PHYSICAL EXAMINATION:  General:  This is a very muscular African American gentleman in no obvious distress.  Vital signs:  Temperature of 98, pulse 55, respiratory rate 18, blood pressure 143/80.  Weight is 238 pounds.  Head and neck:  Normocephalic, atraumatic skull.  There are no ocular or oral lesions.  There are no palpable cervical or supraclavicular lymph nodes.  Lungs:  Clear bilaterally.  Cardiac: Regular rate and rhythm with a normal S1 and S2.  There are no murmurs, rubs, or bruits.  Abdomen:  Soft.  He has good bowel sounds.  There is no fluid wave.  There is no palpable abdominal mass.  There is no palpable hepatosplenomegaly.  Extremities:  No clubbing, cyanosis, or edema.  No palpable venous cord is noted in his legs.  He has good range of motion of his joints.  He has good strength in his upper and lower extremities.  Skin:  No rashes, ecchymosis, or petechia.  Neurological: No focal neurological deficit.  LABORATORY STUDIES:  White cell count is 4.6, hemoglobin 14.1, hematocrit 43.3, platelet count 243.  MCV is  68.  IMPRESSION:  Mr. Sako is a nice 42 year old gentleman with a history of bilateral pulmonary emboli.  He has had an extensive hypercoagulable study done.  So far, no thrombophilic states are noted.  Again, I believe 2 years of anticoagulation is appropriate for him. Once he finishes the Xarelto, then we can get him on to aspirin.  I want to see him back in June of next year.  At that point in time, we can probably plan to transition him over to aspirin at 162 mg a day.  I do not see that we need any scans or Dopplers before we see him back in June 2015.    ______________________________ Josph Macho, M.D. PRE/MEDQ  D:  04/20/2013  T:  04/21/2013  Job:  [id number]

## 2013-04-22 LAB — HEMOGLOBINOPATHY EVALUATION
Hemoglobin Other: 0 %
Hgb A: 97.3 % (ref 96.8–97.8)
Hgb S Quant: 0 %

## 2013-12-21 ENCOUNTER — Other Ambulatory Visit (HOSPITAL_BASED_OUTPATIENT_CLINIC_OR_DEPARTMENT_OTHER): Payer: BC Managed Care – PPO | Admitting: Lab

## 2013-12-21 ENCOUNTER — Ambulatory Visit (HOSPITAL_BASED_OUTPATIENT_CLINIC_OR_DEPARTMENT_OTHER): Payer: BC Managed Care – PPO | Admitting: Hematology & Oncology

## 2013-12-21 ENCOUNTER — Encounter: Payer: Self-pay | Admitting: Hematology & Oncology

## 2013-12-21 VITALS — BP 130/82 | HR 57 | Temp 97.9°F | Resp 18 | Ht 69.0 in | Wt 247.0 lb

## 2013-12-21 DIAGNOSIS — I2699 Other pulmonary embolism without acute cor pulmonale: Secondary | ICD-10-CM

## 2013-12-21 DIAGNOSIS — Z7901 Long term (current) use of anticoagulants: Secondary | ICD-10-CM

## 2013-12-21 LAB — D-DIMER, QUANTITATIVE: D-Dimer, Quant: 0.31 ug/mL-FEU (ref 0.00–0.48)

## 2013-12-21 LAB — CBC WITH DIFFERENTIAL (CANCER CENTER ONLY)
BASO#: 0 10*3/uL (ref 0.0–0.2)
BASO%: 0.4 % (ref 0.0–2.0)
EOS%: 4.2 % (ref 0.0–7.0)
Eosinophils Absolute: 0.2 10*3/uL (ref 0.0–0.5)
HEMATOCRIT: 42.2 % (ref 38.7–49.9)
HGB: 14 g/dL (ref 13.0–17.1)
LYMPH#: 2.8 10*3/uL (ref 0.9–3.3)
LYMPH%: 50 % — AB (ref 14.0–48.0)
MCH: 22.9 pg — ABNORMAL LOW (ref 28.0–33.4)
MCHC: 33.2 g/dL (ref 32.0–35.9)
MCV: 69 fL — ABNORMAL LOW (ref 82–98)
MONO#: 0.5 10*3/uL (ref 0.1–0.9)
MONO%: 8.3 % (ref 0.0–13.0)
NEUT#: 2.1 10*3/uL (ref 1.5–6.5)
NEUT%: 37.1 % — AB (ref 40.0–80.0)
Platelets: 273 10*3/uL (ref 145–400)
RBC: 6.12 10*6/uL — ABNORMAL HIGH (ref 4.20–5.70)
RDW: 16.7 % — AB (ref 11.1–15.7)
WBC: 5.7 10*3/uL (ref 4.0–10.0)

## 2013-12-21 NOTE — Progress Notes (Signed)
Hematology and Oncology Follow Up Visit  [name] [name] [id number] Feb 23, 1971 43 y.o. 12/21/2013   Principle Diagnosis:   Bilateral pulmonary emboli-idiopathic  Current Therapy:    Patient to complete 2 years of Xarelto in July  Aspirin 162 mg by mouth daily to start once Xarelto is finished     Interim History:  [name] [name] is back for followup. He's doing well. We last saw back in October. He'll finish up his Xarelto in July.  He's had no cough or shortness of breath. He's had no leg swelling. His skin has had no rashes. He's had no bleeding. He's had no change in bowel or bladder habits.  We last saw him, his d-dimer was 0.27.    Medications: Current outpatient prescriptions:atorvastatin (LIPITOR) 20 MG tablet, Take 10 mg by mouth daily. , Disp: , Rfl: ;  fish oil-omega-3 fatty acids 1000 MG capsule, Take 2 g by mouth daily., Disp: , Rfl: ;  Multiple Vitamin (MULITIVITAMIN WITH MINERALS) TABS, Take 1 tablet by mouth daily., Disp: , Rfl: ;  omeprazole (PRILOSEC) 20 MG capsule, Take 20 mg by mouth daily as needed. For indigestion., Disp: , Rfl:  Rivaroxaban (XARELTO) 20 MG TABS, Take 1 tablet by mouth daily. Anticoagulant, durative of heparin, Disp: , Rfl: ;  vitamin C (ASCORBIC ACID) 500 MG tablet, Take 500 mg by mouth daily., Disp: , Rfl:   Allergies: No Known Allergies  Past Medical History, Surgical history, Social history, and Family History were reviewed and updated.  Review of Systems: As above  Physical Exam:  height is 5\' 9"  (1.753 m) and weight is 247 lb (112.038 kg). His oral temperature is 97.9 F (36.6 C). His blood pressure is 130/82 and his pulse is 57. His respiration is 18.   Very muscular gentleman. His lungs are clear. Cardiac exam irregular rhythm. He has no murmurs. Abdomen is soft. Has good bowel sounds. There is no fluid wave. There is no palpable liver or spleen tip. Back exam no tenderness over the spine ribs or hips. Extremities shows no clubbing  cyanosis or edema. No venous cords noted in his legs. He has good range of motion of his joints. Has good pulses his distal joints. Skin exam shows no rashes ecchymoses or petechia. Neurological exam is nonfocal.  Lab Results  Component Value Date   WBC 5.7 12/21/2013   HGB 14.0 12/21/2013   HCT 42.2 12/21/2013   MCV 69* 12/21/2013   PLT 273 12/21/2013     Chemistry      Component Value Date/Time   NA 136 12/19/2011 0610   K 4.1 12/19/2011 0610   CL 100 12/19/2011 0610   CO2 26 12/19/2011 0610   BUN 11 12/19/2011 0610   CREATININE 1.09 12/19/2011 0610      Component Value Date/Time   CALCIUM 9.4 12/19/2011 0610   ALKPHOS 86 12/18/2011 2145   AST 24 12/18/2011 2145   ALT 28 12/18/2011 2145   BILITOT 0.3 12/18/2011 2145         Impression and Plan: [name] [name] is 4-year-old gentleman. He is idiopathic pulmonary emboli. He has been on 2 years of anticoagulation. He'll finish up in July. I'll have him on baby aspirin. I think it 162 mg a baby aspirin a day would be appropriate for him.  Of note, he does have a very low MCV. He has a normal hemoglobin electrophoresis. I suspect that he probably has alpha thalassemia.  At this point, I don't we will have to see  him back. There is not much we're going for his health care right now.  I told that him that he can always come back to see [place] if he does have any issues.     Volanda Napoleon, MD 6/8/201510:25 AM

## 2015-04-07 ENCOUNTER — Other Ambulatory Visit (HOSPITAL_COMMUNITY): Payer: BLUE CROSS/BLUE SHIELD

## 2015-04-07 ENCOUNTER — Emergency Department (HOSPITAL_COMMUNITY): Payer: BLUE CROSS/BLUE SHIELD

## 2015-04-07 ENCOUNTER — Observation Stay (HOSPITAL_COMMUNITY)
Admission: EM | Admit: 2015-04-07 | Discharge: 2015-04-10 | Disposition: A | Discharge status: Home / Self Care | Payer: BLUE CROSS/BLUE SHIELD | Attending: Internal Medicine | Admitting: Internal Medicine

## 2015-04-07 ENCOUNTER — Encounter (HOSPITAL_COMMUNITY): Payer: Self-pay | Admitting: Emergency Medicine

## 2015-04-07 DIAGNOSIS — R091 Pleurisy: Secondary | ICD-10-CM | POA: Diagnosis not present

## 2015-04-07 DIAGNOSIS — E785 Hyperlipidemia, unspecified: Secondary | ICD-10-CM | POA: Diagnosis not present

## 2015-04-07 DIAGNOSIS — R030 Elevated blood-pressure reading, without diagnosis of hypertension: Secondary | ICD-10-CM | POA: Diagnosis not present

## 2015-04-07 DIAGNOSIS — R079 Chest pain, unspecified: Secondary | ICD-10-CM | POA: Diagnosis not present

## 2015-04-07 DIAGNOSIS — M545 Low back pain: Secondary | ICD-10-CM | POA: Insufficient documentation

## 2015-04-07 DIAGNOSIS — Z7901 Long term (current) use of anticoagulants: Secondary | ICD-10-CM | POA: Insufficient documentation

## 2015-04-07 DIAGNOSIS — G4733 Obstructive sleep apnea (adult) (pediatric): Secondary | ICD-10-CM | POA: Diagnosis present

## 2015-04-07 DIAGNOSIS — Z79899 Other long term (current) drug therapy: Secondary | ICD-10-CM | POA: Diagnosis not present

## 2015-04-07 DIAGNOSIS — I2699 Other pulmonary embolism without acute cor pulmonale: Secondary | ICD-10-CM | POA: Diagnosis not present

## 2015-04-07 DIAGNOSIS — R509 Fever, unspecified: Secondary | ICD-10-CM | POA: Diagnosis not present

## 2015-04-07 DIAGNOSIS — Z791 Long term (current) use of non-steroidal anti-inflammatories (NSAID): Secondary | ICD-10-CM | POA: Diagnosis not present

## 2015-04-07 DIAGNOSIS — Z87891 Personal history of nicotine dependence: Secondary | ICD-10-CM | POA: Insufficient documentation

## 2015-04-07 DIAGNOSIS — Z8249 Family history of ischemic heart disease and other diseases of the circulatory system: Secondary | ICD-10-CM | POA: Insufficient documentation

## 2015-04-07 DIAGNOSIS — R0602 Shortness of breath: Secondary | ICD-10-CM | POA: Diagnosis present

## 2015-04-07 DIAGNOSIS — E780 Pure hypercholesterolemia: Secondary | ICD-10-CM | POA: Diagnosis not present

## 2015-04-07 DIAGNOSIS — R0682 Tachypnea, not elsewhere classified: Secondary | ICD-10-CM | POA: Diagnosis not present

## 2015-04-07 DIAGNOSIS — R7309 Other abnormal glucose: Secondary | ICD-10-CM | POA: Insufficient documentation

## 2015-04-07 DIAGNOSIS — Z86711 Personal history of pulmonary embolism: Secondary | ICD-10-CM | POA: Insufficient documentation

## 2015-04-07 DIAGNOSIS — R7303 Prediabetes: Secondary | ICD-10-CM | POA: Diagnosis present

## 2015-04-07 DIAGNOSIS — Z9989 Dependence on other enabling machines and devices: Secondary | ICD-10-CM | POA: Diagnosis present

## 2015-04-07 HISTORY — DX: Other pulmonary embolism without acute cor pulmonale: I26.99

## 2015-04-07 LAB — CBC WITH DIFFERENTIAL/PLATELET
Basophils Absolute: 0 10*3/uL (ref 0.0–0.1)
Basophils Relative: 0 %
EOS PCT: 2 %
Eosinophils Absolute: 0.2 10*3/uL (ref 0.0–0.7)
HEMATOCRIT: 39.6 % (ref 39.0–52.0)
Hemoglobin: 12.9 g/dL — ABNORMAL LOW (ref 13.0–17.0)
LYMPHS ABS: 2.9 10*3/uL (ref 0.7–4.0)
Lymphocytes Relative: 33 %
MCH: 22.3 pg — ABNORMAL LOW (ref 26.0–34.0)
MCHC: 32.6 g/dL (ref 30.0–36.0)
MCV: 68.5 fL — AB (ref 78.0–100.0)
MONOS PCT: 8 %
Monocytes Absolute: 0.7 10*3/uL (ref 0.1–1.0)
NEUTROS ABS: 5.1 10*3/uL (ref 1.7–7.7)
Neutrophils Relative %: 57 %
Platelets: 239 10*3/uL (ref 150–400)
RBC: 5.78 MIL/uL (ref 4.22–5.81)
RDW: 15.4 % (ref 11.5–15.5)
WBC: 8.9 10*3/uL (ref 4.0–10.5)

## 2015-04-07 LAB — COMPREHENSIVE METABOLIC PANEL
ALBUMIN: 4.2 g/dL (ref 3.5–5.0)
ALT: 20 U/L (ref 17–63)
AST: 27 U/L (ref 15–41)
Alkaline Phosphatase: 79 U/L (ref 38–126)
Anion gap: 9 (ref 5–15)
BILIRUBIN TOTAL: 0.5 mg/dL (ref 0.3–1.2)
BUN: 17 mg/dL (ref 6–20)
CO2: 25 mmol/L (ref 22–32)
Calcium: 9.5 mg/dL (ref 8.9–10.3)
Chloride: 105 mmol/L (ref 101–111)
Creatinine, Ser: 1.13 mg/dL (ref 0.61–1.24)
GFR calc Af Amer: >60 mL/min (ref >60)
GFR calc non Af Amer: >60 mL/min (ref >60)
GLUCOSE: 111 mg/dL — AB (ref 65–99)
POTASSIUM: 3.9 mmol/L (ref 3.5–5.1)
SODIUM: 139 mmol/L (ref 135–145)
TOTAL PROTEIN: 7.7 g/dL (ref 6.5–8.1)

## 2015-04-07 LAB — URINALYSIS, ROUTINE W REFLEX MICROSCOPIC
Bilirubin Urine: NEGATIVE
Glucose, UA: NEGATIVE mg/dL
Hgb urine dipstick: NEGATIVE
Ketones, ur: NEGATIVE mg/dL
LEUKOCYTES UA: NEGATIVE
NITRITE: NEGATIVE
PH: 5.5 (ref 5.0–8.0)
Protein, ur: NEGATIVE mg/dL
SPECIFIC GRAVITY, URINE: 1.023 (ref 1.005–1.030)
Urobilinogen, UA: 1 mg/dL (ref 0.0–1.0)

## 2015-04-07 LAB — PROTIME-INR
INR: 0.98 (ref 0.00–1.49)
Prothrombin Time: 13.2 seconds (ref 11.6–15.2)

## 2015-04-07 LAB — I-STAT TROPONIN, ED: Troponin i, poc: 0.01 ng/mL (ref 0.00–0.08)

## 2015-04-07 LAB — I-STAT CG4 LACTIC ACID, ED
LACTIC ACID, VENOUS: 0.94 mmol/L (ref 0.5–2.0)
Lactic Acid, Venous: 0.54 mmol/L (ref 0.5–2.0)

## 2015-04-07 LAB — APTT: aPTT: 29 seconds (ref 24–37)

## 2015-04-07 MED ORDER — RIVAROXABAN 15 MG PO TABS
15.0000 mg | ORAL_TABLET | Freq: Two times a day (BID) | ORAL | Status: DC
  Administered 2015-04-07 – 2015-04-10 (×7): 15 mg via ORAL
  Filled 2015-04-07 (×9): qty 1

## 2015-04-07 MED ORDER — SODIUM CHLORIDE 0.9 % IV BOLUS (SEPSIS)
1000.0000 mL | INTRAVENOUS | Status: DC
  Administered 2015-04-07 (×3): 1000 mL via INTRAVENOUS

## 2015-04-07 MED ORDER — DEXTROSE 5 % IV SOLN
500.0000 mg | Freq: Once | INTRAVENOUS | Status: AC
  Administered 2015-04-07: 500 mg via INTRAVENOUS
  Filled 2015-04-07: qty 500

## 2015-04-07 MED ORDER — ACETAMINOPHEN 650 MG RE SUPP: 650.0000 mg | Freq: Four times a day (QID) | RECTAL | Status: DC | PRN

## 2015-04-07 MED ORDER — ACETAMINOPHEN 500 MG PO TABS
1000.0000 mg | ORAL_TABLET | Freq: Once | ORAL | Status: AC
  Administered 2015-04-07: 1000 mg via ORAL
  Filled 2015-04-07: qty 2

## 2015-04-07 MED ORDER — HEPARIN BOLUS VIA INFUSION
3000.0000 [IU] | Freq: Once | INTRAVENOUS | Status: AC
  Administered 2015-04-07: 3000 [IU] via INTRAVENOUS
  Filled 2015-04-07: qty 3000

## 2015-04-07 MED ORDER — ADULT MULTIVITAMIN W/MINERALS CH
1.0000 | ORAL_TABLET | Freq: Every day | ORAL | Status: DC
  Administered 2015-04-07 – 2015-04-10 (×4): 1 via ORAL
  Filled 2015-04-07 (×4): qty 1

## 2015-04-07 MED ORDER — HEPARIN (PORCINE) IN NACL 100-0.45 UNIT/ML-% IJ SOLN
1800.0000 [IU]/h | INTRAMUSCULAR | Status: DC
  Administered 2015-04-07 (×2): 1800 [IU]/h via INTRAVENOUS
  Filled 2015-04-07: qty 250

## 2015-04-07 MED ORDER — IOHEXOL 350 MG/ML SOLN
100.0000 mL | Freq: Once | INTRAVENOUS | Status: AC | PRN
  Administered 2015-04-07: 100 mL via INTRAVENOUS

## 2015-04-07 MED ORDER — DEXTROSE 5 % IV SOLN
1.0000 g | Freq: Once | INTRAVENOUS | Status: AC
  Administered 2015-04-07: 1 g via INTRAVENOUS
  Filled 2015-04-07: qty 10

## 2015-04-07 MED ORDER — ENOXAPARIN SODIUM 100 MG/ML ~~LOC~~ SOLN: 100.0000 mg | Freq: Once | SUBCUTANEOUS | Status: DC

## 2015-04-07 MED ORDER — ACETAMINOPHEN 325 MG PO TABS
650.0000 mg | ORAL_TABLET | Freq: Four times a day (QID) | ORAL | Status: DC | PRN
  Administered 2015-04-07 – 2015-04-10 (×4): 650 mg via ORAL
  Filled 2015-04-07 (×4): qty 2

## 2015-04-07 MED ORDER — SODIUM CHLORIDE 0.9 % IV BOLUS (SEPSIS)
1000.0000 mL | Freq: Once | INTRAVENOUS | Status: AC
  Administered 2015-04-07: 1000 mL via INTRAVENOUS

## 2015-04-07 MED ORDER — ALBUTEROL SULFATE (2.5 MG/3ML) 0.083% IN NEBU: 2.5000 mg | INHALATION_SOLUTION | RESPIRATORY_TRACT | Status: DC | PRN

## 2015-04-07 MED ORDER — SODIUM CHLORIDE 0.9 % IJ SOLN
3.0000 mL | Freq: Two times a day (BID) | INTRAMUSCULAR | Status: DC
  Administered 2015-04-07 – 2015-04-10 (×7): 3 mL via INTRAVENOUS

## 2015-04-07 NOTE — ED Notes (Signed)
Called lab to follow up on results of CMP.  Was told HIS, computer system canceled the order per Amy but she sees new order so will be running specimen.

## 2015-04-07 NOTE — Discharge Instructions (Addendum)
Information on my medicine - XARELTO (rivaroxaban)  This medication education was reviewed with me or my healthcare representative as part of my discharge preparation.  The pharmacist that spoke with me during my hospital stay was:  Hershal Coria, [place]? Xarelto was prescribed to treat blood clots that may have been found in the veins of your legs (deep vein thrombosis) or in your lungs (pulmonary embolism) and to reduce the risk of them occurring again.  What do you need to know about Xarelto? The starting dose is one 15 mg tablet taken TWICE daily with food for the FIRST 21 DAYS then on (enter date)  04/28/2015  the dose is changed to one 20 mg tablet taken ONCE A DAY with your evening meal.  DO NOT stop taking Xarelto without talking to the health care provider who prescribed the medication.  Refill your prescription for 20 mg tablets before you run out.  After discharge, you should have regular check-up appointments with your healthcare provider that is prescribing your Xarelto.  In the future your dose may need to be changed if your kidney function changes by a significant amount.  What do you do if you miss a dose? If you are taking Xarelto TWICE DAILY and you miss a dose, take it as soon as you remember. You may take two 15 mg tablets (total 30 mg) at the same time then resume your regularly scheduled 15 mg twice daily the next day.  If you are taking Xarelto ONCE DAILY and you miss a dose, take it as soon as you remember on the same day then continue your regularly scheduled once daily regimen the next day. Do not take two doses of Xarelto at the same time.   Important Safety Information Xarelto is a blood thinner medicine that can cause bleeding. You should call your healthcare provider right away if you experience any of the following: ? Bleeding from an injury or your nose that does not stop. ? Unusual colored urine (red or dark brown) or  unusual colored stools (red or black). ? Unusual bruising for unknown reasons. ? A serious fall or if you hit your head (even if there is no bleeding).  Some medicines may interact with Xarelto and might increase your risk of bleeding while on Xarelto. To help avoid this, consult your healthcare provider or pharmacist prior to using any new prescription or non-prescription medications, including herbals, vitamins, non-steroidal anti-inflammatory drugs (NSAIDs) and supplements.  This website has more information on Xarelto: [email or web address]   Pulmonary Embolism A pulmonary (lung) embolism (PE) is a blood clot that has traveled to the lung and results in a blockage of blood flow in the affected lung. Most clots come from deep veins in the legs or pelvis. PE is a dangerous and potentially life-threatening condition that can be treated if identified. CAUSES Blood clots form in a vein for different reasons. Usually several things cause blood clots. They include:  The flow of blood slows down.  The inside of the vein is damaged in some way.  The person has a condition that makes the blood clot more easily. RISK FACTORS Some people are more likely than others to develop PE. Risk factors include:   Smoking.  Being overweight (obese).  Sitting or lying still for a long time. This includes long-distance travel, paralysis, or recovery from an illness or surgery. Other factors that increase risk are:   Older age, especially over 61 years of  age.  Having a family history of blood clots or if you have already had a blood clot.  Having major or lengthy surgery. This is especially true for surgery on the hip, knee, or belly (abdomen). Hip surgery is particularly high risk.  Having a long, thin tube (catheter) placed inside a vein during a medical procedure.  Breaking a hip or leg.  Having cancer or cancer treatment.  Medicines containing the male hormone estrogen. This includes birth  control pills and hormone replacement therapy.  Other circulation or heart problems.  Pregnancy and childbirth.  Hormone changes make the blood clot more easily during pregnancy.  The fetus puts pressure on the veins of the pelvis.  There is a risk of injury to veins during delivery or a caesarean delivery. The risk is highest just after childbirth.  PREVENTION   Exercise the legs regularly. Take a brisk 30 minute walk every day.  Maintain a weight that is appropriate for your height.  Avoid sitting or lying in bed for long periods of time without moving your legs.  Women, particularly those over the age of 67 years, should consider the risks and benefits of taking estrogen medicines, including birth control pills.  Do not smoke, especially if you take estrogen medicines.  Long-distance travel can increase your risk. You should exercise your legs by walking or pumping the muscles every hour.  Many of the risk factors above relate to situations that exist with hospitalization, either for illness, injury, or elective surgery. Prevention may include medical and nonmedical measures.   Your health care provider will assess you for the need for venous thromboembolism prevention when you are admitted to the hospital. If you are having surgery, your surgeon will assess you the day of or day after surgery.  SYMPTOMS  The symptoms of a PE usually start suddenly and include:  Shortness of breath.  Coughing.  Coughing up blood or blood-tinged mucus.  Chest pain. Pain is often worse with deep breaths.  Rapid heartbeat. DIAGNOSIS  If a PE is suspected, your health care provider will take a medical history and perform a physical exam. Other tests that may be required include:  Blood tests, such as studies of the clotting properties of your blood.  Imaging tests, such as ultrasound, CT, MRI, and other tests to see if you have clots in your legs or lungs.  An electrocardiogram. This  can look for heart strain from blood clots in the lungs. TREATMENT   The most common treatment for a PE is blood thinning (anticoagulant) medicine, which reduces the blood's tendency to clot. Anticoagulants can stop new blood clots from forming and old clots from growing. They cannot dissolve existing clots. Your body does this by itself over time. Anticoagulants can be given by mouth, through an intravenous (IV) tube, or by injection. Your health care provider will determine the best program for you.  Less commonly, clot-dissolving medicines (thrombolytics) are used to dissolve a PE. They carry a high risk of bleeding, so they are used mainly in severe cases.  Very rarely, a blood clot in the leg needs to be removed surgically.  If you are unable to take anticoagulants, your health care provider may arrange for you to have a filter placed in a main vein in your abdomen. This filter prevents clots from traveling to your lungs. HOME CARE INSTRUCTIONS   Take all medicines as directed by your health care provider.  Learn as much as you can about DVT.  Wear  a medical alert bracelet or carry a medical alert card.  Ask your health care provider how soon you can go back to normal activities. It is important to stay active to prevent blood clots. If you are on anticoagulant medicine, avoid contact sports.  It is very important to exercise. This is especially important while traveling, sitting, or standing for long periods of time. Exercise your legs by walking or by tightening and relaxing your leg muscles regularly. Take frequent walks.  You may need to wear compression stockings. These are tight elastic stockings that apply pressure to the lower legs. This pressure can help keep the blood in the legs from clotting. Taking Warfarin Warfarin is a daily medicine that is taken by mouth. Your health care provider will advise you on the length of treatment (usually 3-6 months, sometimes lifelong). If you  take warfarin:  Understand how to take warfarin and foods that can affect how warfarin works in Veterinary surgeon.  Too much and too little warfarin are both dangerous. Too much warfarin increases the risk of bleeding. Too little warfarin continues to allow the risk for blood clots. Warfarin and Regular Blood Testing While taking warfarin, you will need to have regular blood tests to measure your blood clotting time. These blood tests usually include both the prothrombin time (PT) and international normalized ratio (INR) tests. The PT and INR results allow your health care provider to adjust your dose of warfarin. It is very important that you have your PT and INR tested as often as directed by your health care provider.  Warfarin and Your Diet Avoid major changes in your diet, or notify your health care provider before changing your diet. Arrange a visit with a registered dietitian to answer your questions. Many foods, especially foods high in vitamin K, can interfere with warfarin and affect the PT and INR results. You should eat a consistent amount of foods high in vitamin K. Foods high in vitamin K include:   Spinach, kale, broccoli, cabbage, collard and turnip greens, Brussels sprouts, peas, cauliflower, seaweed, and parsley.  Beef and pork liver.  Green tea.  Soybean oil. Warfarin with Other Medicines Many medicines can interfere with warfarin and affect the PT and INR results. You must:  Tell your health care provider about any and all medicines, vitamins, and supplements you take, including aspirin and other over-the-counter anti-inflammatory medicines. Be especially cautious with aspirin and anti-inflammatory medicines. Ask your health care provider before taking these.  Do not take or discontinue any prescribed or over-the-counter medicine except on the advice of your health care provider or pharmacist. Warfarin Side Effects Warfarin can have side effects, such as easy bruising and  difficulty stopping bleeding. Ask your health care provider or pharmacist about other side effects of warfarin. You will need to:  Hold pressure over cuts for longer than usual.  Notify your dentist and other health care providers that you are taking warfarin before you undergo any procedures where bleeding may occur. Warfarin with Alcohol and Tobacco   Drinking alcohol frequently can increase the effect of warfarin, leading to excess bleeding. It is best to avoid alcoholic drinks or consume only very small amounts while taking warfarin. Notify your health care provider if you change your alcohol intake.  Do not use any tobacco products including cigarettes, chewing tobacco, or electronic cigarettes. If you smoke, quit. Ask your health care provider for help with quitting smoking. Alternative Medicines to Warfarin: Factor Xa Inhibitor Medicines  These blood thinning medicines  are taken by mouth, usually for several weeks or longer. It is important to take the medicine every single day, at the same time each day.  There are no regular blood tests required when using these medicines.  There are fewer food and drug interactions than with warfarin.  The side effects of this class of medicine is similar to that of warfarin, including excessive bruising or bleeding. Ask your health care provider or pharmacist about other potential side effects. SEEK MEDICAL CARE IF:   You notice a rapid heartbeat.  You feel weaker or more tired than usual.  You feel faint.  You notice increased bruising.  Your symptoms are not getting better in the time expected.  You are having side effects of medicine. SEEK IMMEDIATE MEDICAL CARE IF:   You have chest pain.  You have trouble breathing.  You have new or increased swelling or pain in one leg.  You cough up blood.  You notice blood in vomit, in a bowel movement, or in urine.  You have a fever. Symptoms of PE may represent a serious problem that  is an emergency. Do not wait to see if the symptoms will go away. Get medical help right away. Call your local emergency services (911 in the [place]). Do not drive yourself to the hospital. Document Released: 06/29/2000 Document Revised: 11/16/2013 Document Reviewed: 07/13/2013 [facility] Patient Information 2015 [place], [place]. This information is not intended to replace advice given to you by your health care provider. Make sure you discuss any questions you have with your health care provider.

## 2015-04-07 NOTE — Progress Notes (Signed)
After frequent calls to [facility] [phone] plus pt mail order [facility] [phone] ED CM confirmed pt will be able to use the xarelto starter kit after activated by pt for first retail doses and then call [facility] at [phone] to get xarelto order at a cost of $100 for a 90 day supply per Lattie Haw and [facility] of [facility] Dr Lindaann Pascal consulted and updated  Pt updated at ext [phone] Pt voiced understanding that he will activate the xarelto starter pack at his preferred retail pharmacy and the call or send his Rx to [facility] for 90 day supply mail order at $100 Pt states he is ok with the process and cost since he has had to do this before r/t recurrent PE  CM entered directions and needed contact information for [facility] in pt's follow up d/c instructions as agreed upon with pt

## 2015-04-07 NOTE — ED Provider Notes (Signed)
CSN: [id number]     Arrival date & time 04/07/15  9924 History   First MD Initiated Contact with Patient 04/07/15 [phone]     Chief Complaint  Patient presents with  . Shortness of Breath     (Consider location/radiation/quality/duration/timing/severity/associated sxs/prior Treatment) HPI   Patient is a 44 year old male with history of  HLD and PE that occurred 3 years ago, patient is not currently anticoagulated, he presents to the ER today for evaluation of right-sided pleuritic chest pain and shortness of breath that began yesterday at noon.  Pain was located in his right side, radiated across to his right anterior chest below his lower ribs, also radiated across his back, he describes it as a sharp pressure that feels like someone sticking their elbow into him, severity rated 9 out of 10. The pain first began it woke him from his sleep and lasted approximately 15 minutes. He got out of bed and began to get ready for work when the pain eased up but never completely went away. 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 His pain and shortness of breath are worse when laying flat and improved when sitting upright. He denies any palpitations, lower extremity edema, lightheadedness. He denies any recent cold or cough like symptoms, denies fever, chills, sweats. He denies any abdominal pain nausea or vomiting He has no other complaints at this time  Past Medical History  Diagnosis Date  . Hypercholesteremia   . Pulmonary emboli    History reviewed. No pertinent past surgical history. Family History  Problem Relation Age of Onset  . Cancer Mother   . Hypertension Father   . Pulmonary embolism Sister   . Pulmonary embolism Other    Social History  Substance Use Topics  . Smoking status: Former Smoker -- 0.50 packs/day for 20 years    Types: Cigarettes     Start date: 07/24/1987    Quit date: 08/24/2007  . Smokeless tobacco: Never Used     Comment: quit smoking 6 years ago  . Alcohol Use: Yes     Comment: occ    Review of Systems 10 Systems reviewed and are negative for acute change except as noted in the HPI.      Allergies  Review of patient's allergies indicates no known allergies.  Home Medications   Prior to Admission medications   Medication Sig Start Date End Date Taking? Authorizing Provider  atorvastatin (LIPITOR) 20 MG tablet Take 10 mg by mouth daily.  03/03/12   Historical Provider, MD  fish oil-omega-3 fatty acids 1000 MG capsule Take 2 g by mouth daily.    Historical Provider, MD  Multiple Vitamin (MULITIVITAMIN WITH MINERALS) TABS Take 1 tablet by mouth daily.    Historical Provider, MD  omeprazole (PRILOSEC) 20 MG capsule Take 20 mg by mouth daily as needed. For indigestion.    Historical Provider, MD  Rivaroxaban (XARELTO) 20 MG TABS Take 1 tablet by mouth daily. Anticoagulant, durative of heparin 01/15/12   Nishant Dhungel, MD  vitamin C (ASCORBIC ACID) 500 MG tablet Take 500 mg by mouth daily.    Historical Provider, MD   BP 155/96 mmHg  Pulse 115  Temp(Src) 99.7 F (37.6 C) (Oral)  Resp 24  Ht 5\' 10"  (1.778 m)  Wt 260 lb (117.935 kg)  BMI 37.31 kg/m2  SpO2 97% Physical Exam  Constitutional: He is oriented to person, place, and time. He appears well-developed and well-nourished. No distress.  HENT:  Head: Normocephalic and atraumatic.  Nose: Nose normal.  Mouth/Throat: Oropharynx is clear and moist. No oropharyngeal exudate.  Eyes: Conjunctivae and EOM are normal. Pupils are equal, round, and reactive to light. Right eye exhibits no discharge. Left eye exhibits no discharge. No scleral icterus.  Neck: Normal range of motion. No JVD present. No tracheal deviation present. No thyromegaly present.  Cardiovascular: Normal rate, regular rhythm, normal heart sounds and intact distal pulses.  Exam reveals no  gallop and no friction rub.   No murmur heard. Pulmonary/Chest: No respiratory distress. He has no wheezes. He has rales. He exhibits no tenderness.  Tachypnea, patient speaking in full sentences, rapid shallow breaths.  No breath sounds in right lower to mid lung yields, rales right mid lung, rales the left base with coarse BS, no wheeze  Percussion half way up right lung  Abdominal: Soft. Bowel sounds are normal. He exhibits no distension and no mass. There is no tenderness. There is no rebound and no guarding.  Musculoskeletal: Normal range of motion. He exhibits no edema or tenderness.  Lymphadenopathy:    He has no cervical adenopathy.  Neurological: He is alert and oriented to person, place, and time. He has normal reflexes. No cranial nerve deficit. He exhibits normal muscle tone. Coordination normal.  Skin: Skin is warm and dry. No rash noted. He is not diaphoretic. No erythema. No pallor.  Psychiatric: He has a normal mood and affect. His behavior is normal. Judgment and thought content normal.  Nursing note and vitals reviewed.   ED Course  Procedures (including critical care time) Labs Review Labs Reviewed - No data to display  Imaging Review No results found. I have personally reviewed and evaluated these images and lab results as part of my medical decision-making.   EKG Interpretation   Date/Time:  Thursday April 07 2015 06:50:22 EDT Ventricular Rate:  107 PR Interval:  187 QRS Duration: 99 QT Interval:  334 QTC Calculation: 446 R Axis:   37 Text Interpretation:  Sinus tachycardia Otherwise within normal limits  When compared with ECG of 12/18/2011, No significant change was found  Confirmed by [facility]  MD, DAVID ([id number]) on 04/07/2015 7:01:24 AM      MDM   Final diagnoses:  None    Pt with tachypnea, tachycardia, decreased BS half way up right lung, Hx of PE  Pt covered for pulmonary infection while working up for PE CT angio revealed RLL PE, pt fluids  d/c'd, he did receive PO tylenol, 2300 mL fluids, amox and rocephin  [facility] was called for admission Dr. Algis Liming to admit, will come see pt, holding orders for admit to step-down for further work up and treatment Heparin drip - dosed per pharmacy  - as per Dr. Herschel Sen[place], PA-C 04/15/15 6060  Charlesetta Shanks, MD 04/27/15 [phone]

## 2015-04-07 NOTE — Progress Notes (Addendum)
1147 ED CM discussed Xarelto being on Tier 2 for [employer], provided a discount card and written information for [employer]BS site to review tier medication costs Pt voiced understanding that xarelto will be a higher cost and use of xarelto carepath trial offer card All items placed in a pt belonging bag along with pt personal bag with an ipad, his keys, watch and shirt  1131 ED Cm spoke with Dr Margreta Journey about pt dx, disposition (feels pt can be d/c in am) and concern with getting pt on medication regimen

## 2015-04-07 NOTE — ED Notes (Signed)
Pt states he has a hx of PE 3 years ago  Pt states last Wednesday he had a pain in his right chest area but it went away  Pt states yesterday morning he woke up with pain in his right lung area and in his back on the right side and the pain has been continuous  Pt states it hurts to take a deep breath so he has been breathing shallow

## 2015-04-07 NOTE — H&P (Signed)
History and Physical  [name] [name] QZE:[id number] DOB: 03-Jun-1971 DOA: 04/07/2015  Referring physician: Delsa Grana, ED PA-C PCP:  Melinda Crutch, MD  Outpatient Specialists:  1. None  Chief Complaint: Dyspnea on exertion, chest pain and right lower back pain  HPI: [name] [name] is a 44 y.o. male, lives with male partner, independent of activities of daily living and works as a Freight forwarder, [place] of PE 3 years ago and completed approximately one year of [place], HLD and newly diagnosed OSA on CPAP, presented to the [facility] ED on 04/07/15 with complaints of dyspnea on exertion, chest pain and right lower back pain. He states that he was in his usual state of health until early morning off 9/21 when he woke up with dyspnea, lower anterior chest pain only on deep inspiration, right lower intermittent mild-to-moderate back pain without radiation. He however proceeded to go to work and works third shift. He noticed that his symptoms persisted and even got a little worse. His dyspnea is on mild exertion. No cough, fever or chills. He chronically sleeps on 2 pillows and denies new onset orthopnea or dyspnea. He recollected that his symptoms were similar to the ones when he had PE years ago and hence decided to come to the ED. He gives history of 2 trips to [place] over the last 1 week but denies leg swelling or pain. He has strong family history of VTE. In the ED workup showed CTA chest consistent with recurrent thromboembolic disease on the right. He feels better since presenting to the ED. Hospitalist admission was requested.   Review of Systems: All systems reviewed and apart from history of presenting illness, are negative.  Past Medical History  Diagnosis Date  . Hypercholesteremia   . Pulmonary emboli    History reviewed. No pertinent past surgical history. Social History:  reports that he quit smoking about 7 years ago. His smoking use included  Cigarettes. He started smoking about 27 years ago. He has a 10 pack-year smoking history. He has never used smokeless tobacco. He reports that he drinks alcohol. He reports that he does not use illicit drugs. Patient lives with his male partner. Former smoker. Occasional beer consumption. Denies drug abuse.  No Known Allergies  Family History  Problem Relation Age of Onset  . Cancer Mother   . Hypertension Father   . Pulmonary embolism Sister   . Pulmonary embolism Other     Prior to Admission medications   Medication Sig Start Date End Date Taking? Authorizing Provider  ibuprofen (ADVIL) 200 MG tablet Take 200 mg by mouth every 6 (six) hours as needed for headache.   Yes Historical Provider, MD  Multiple Vitamin (MULITIVITAMIN WITH MINERALS) TABS Take 1 tablet by mouth daily.   Yes Historical Provider, MD   Physical Exam: Filed Vitals:   04/07/15 0900 04/07/15 0926 04/07/15 0930 04/07/15 1003  BP: 158/96 160/75 155/70   Pulse: 107 105 99   Temp:  99.8 F (37.7 C)  99.1 F (37.3 C)  TempSrc:  Oral  Oral  Resp: 21 20 19    Height:      Weight:      SpO2: 95% 93% 95%      General exam: Moderately built and overweight pleasant young male patient, lying comfortably propped up on the gurney in no obvious distress.  Head, eyes and ENT: Nontraumatic and normocephalic. Pupils equally reacting to light and accommodation. Oral mucosa moist.  Neck: Supple. No  JVD, carotid bruit or thyromegaly.  Lymphatics: No lymphadenopathy.  Respiratory system: Decreased breath sounds in the right base but otherwise clear to auscultation. No increased work of breathing.  Cardiovascular system: S1 and S2 heard, RRR. No JVD, murmurs, gallops, clicks or pedal edema. Telemetry: Sinus rhythm.  Gastrointestinal system: Abdomen is nondistended, soft and nontender. Normal bowel sounds heard. No organomegaly or masses appreciated.  Central nervous system: Alert and oriented. No focal neurological  deficits.  Extremities: Symmetric 5 x 5 power. Peripheral pulses symmetrically felt.   Skin: No rashes or acute findings. Multiple tattoos on upper body.  Musculoskeletal system: Negative exam.  Psychiatry: Pleasant and cooperative.   Labs on Admission:  Basic Metabolic Panel:  Recent Labs Lab 04/07/15 0735  NA 139  K 3.9  CL 105  CO2 25  GLUCOSE 111*  BUN 17  CREATININE 1.13  CALCIUM 9.5   Liver Function Tests:  Recent Labs Lab 04/07/15 0735  AST 27  ALT 20  ALKPHOS 79  BILITOT 0.5  PROT 7.7  ALBUMIN 4.2   No results for input(s): LIPASE, AMYLASE in the last 168 hours. No results for input(s): AMMONIA in the last 168 hours. CBC:  Recent Labs Lab 04/07/15 0736  WBC 8.9  NEUTROABS 5.1  HGB 12.9*  HCT 39.6  MCV 68.5*  PLT 239   Cardiac Enzymes: No results for input(s): CKTOTAL, CKMB, CKMBINDEX, TROPONINI in the last 168 hours.  BNP (last 3 results) No results for input(s): PROBNP in the last 8760 hours. CBG: No results for input(s): GLUCAP in the last 168 hours.  Radiological Exams on Admission: Dg Chest 2 View  04/07/2015   CLINICAL DATA:  History of PE 3 years ago.  Pain RIGHT chest.  EXAM: CHEST  2 VIEW  COMPARISON:  None.  FINDINGS: Normal cardiac silhouette. There is linear opacities projecting over the LEFT and RIGHT hemidiaphragm which are not significant changed from comparison exams. Findings suggest chronic scarring or atelectasis. There are low lung volumes. On the lateral projection there is increased density over the heart concerning for RIGHT middle lobe pneumonia. No pneumothorax.  IMPRESSION: 1. Increased density over the heart on the lateral projection could represent RIGHT middle lobe pneumonia. Recommend correlation for pulmonary infection. 2. Chronic scarring or atelectasis over the LEFT and RIGHT hemidiaphragm.   Electronically Signed   By: Suzy Bouchard M.D.   On: 04/07/2015 07:20   Ct Angio Chest Pe W/cm &/or Wo Cm  04/07/2015    CLINICAL DATA:  Intermittent right chest pain for last 3 days with difficulty taking deep breaths. History of pulmonary embolism. Initial encounter.  EXAM: CT ANGIOGRAPHY CHEST WITH CONTRAST  TECHNIQUE: Multidetector CT imaging of the chest was performed using the standard protocol during bolus administration of intravenous contrast. Multiplanar CT image reconstructions and MIPs were obtained to evaluate the vascular anatomy.  CONTRAST:  164mL OMNIPAQUE IOHEXOL 350 MG/ML SOLN  COMPARISON:  Chest CT 03/28/2012.  FINDINGS: Mediastinum: The pulmonary arteries are adequately, but not optimally opacified with contrast. There is recurrent thromboembolic disease on the right. There is a filling defect in the descending right lower lobe pulmonary artery. There are peripheral filling defects within the segmental right pulmonary arteries. The main pulmonary arteries appear normal. No left-sided filling defects demonstrated. No other significant vascular findings. The heart size is normal. There are no signs of right ventricular strain. There are no enlarged mediastinal, hilar or axillary lymph nodes. The thyroid gland, trachea and esophagus demonstrate no significant findings.  Lungs/Pleura:  There is trace pleural fluid on the right.There are new patchy bibasilar airspace opacities, worse on the right. There is no focal consolidation to suggest infection or infarct.  Upper abdomen: Unremarkable.  There is no adrenal mass.  Musculoskeletal/Chest wall: No chest wall lesion or acute osseous findings.  Review of the MIP images confirms the above findings.  IMPRESSION: 1. Recurrent thromboembolic disease on the right. Some of the emboli are peripheral and likely subacute in age. 2. Associated patchy bibasilar atelectasis. Trace pleural fluid on the right. 3. Critical Value/emergent results were called by telephone at the time of interpretation on 04/07/2015 at 9:30 am to Dr. Varney Biles, who verbally acknowledged these results.    Electronically Signed   By: Richardean Sale M.D.   On: 04/07/2015 09:34    EKG: Independently reviewed. Sinus rhythm, normal axis and no acute changes. QTC 446 ms.  Assessment/Plan Principal Problem:   Recurrent pulmonary embolism Active Problems:   Prediabetes   Hyperlipidemia   OSA on CPAP   PE (pulmonary embolism)   Acute pulmonary embolism, recurrent - Strong family history of VTE - Patient gives prior history of PE 3-4 years ago and completed approximately 1 year course of Xarelto - Discussed with radiology: Mild-to-moderate clot burden and no evidence of right heart strain - Hemodynamically stable. Had been empirically started on IV heparin in ED. - DC IV heparin and start Xarelto-will have to remain on this lifelong unless develops contraindications. No bleeding complications reported. - May need outpatient evaluation by hematology. - We will check 2-D echo.  HLD - Not on medications at home  OSA - Recently diagnosed a month ago. Continue nightly CPAP    DVT prophylaxis. Patient being therapeutically anticoagulated Code Status: Full  Family Communication: None at bedside  Disposition Plan: Possible DC 9/23   Time spent: 3 minutes  HONGALGI,ANAND, MD, FACP, FHM. [facility] Pager [phone]  If 7PM-7AM, please contact night-coverage [email or web address] Password [facility] 04/07/2015, 11:29 AM

## 2015-04-07 NOTE — Procedures (Signed)
Pt brought home Cpap machine. Cpap is in working conditions and pt is placed on home settings. Rt will continue to monitor.

## 2015-04-07 NOTE — Progress Notes (Signed)
ANTICOAGULATION CONSULT NOTE - Initial Consult  Pharmacy Consult for rivaroxaban Indication: pulmonary embolus  No Known Allergies  Patient Measurements: Height: 5\' 10"  (177.8 cm) Weight: 260 lb (117.935 kg) IBW/kg (Calculated) : 73 Heparin Dosing Weight:   Vital Signs: Temp: 99.1 F (37.3 C) (09/22 1003) Temp Source: Oral (09/22 1003) BP: 155/82 mmHg (09/22 1130) Pulse Rate: 94 (09/22 1130)  Labs:  Recent Labs  04/07/15 0735 04/07/15 0736 04/07/15 0738  HGB  --  12.9*  --   HCT  --  39.6  --   PLT  --  239  --   APTT  --   --  29  LABPROT  --   --  13.2  INR  --   --  0.98  CREATININE 1.13  --   --     Estimated Creatinine Clearance: 106.3 mL/min (by C-G formula based on Cr of 1.13).   Medical History: Past Medical History  Diagnosis Date  . Hypercholesteremia   . Pulmonary emboli     Medications:  Scheduled:  . Rivaroxaban  15 mg Oral BID WC    Assessment: Patient's a 44 y.o M with hx idiopathic PE on Xarelto in the past. Dr. Antonieta Pert note in June 2015 indicated that he was treated with xarelto for 2 years and completed course in July 2015. He presented to the ED on 9/22 with c/o CP and SOB. Chest CTA showed "recurrent thromboembolic disease on the right. Some of the emboli are peripheral and likely subacute in age." Patient reported not being on any anticoag. PTA and no bleeding issues or recent surgical procedures. Pt was started on heparin but then decision was made to transition to rivaroxaban. Spoke to RN about stopping heparin infusion and then giving rivaroxaban tablet.    Goal of Therapy:  Treat PE Monitor platelets by anticoagulation protocol: Yes   Plan:  Will start Rivaroxaban 15 mg PO BID WC x 21 days, then start rivaroxaban 20 mg PO daily. Monitor for s/sx of bleeding   Royetta Asal PharmD, BCPS 04/07/15 12:08

## 2015-04-07 NOTE — ED Notes (Signed)
Pt can go to floor at 11:50

## 2015-04-07 NOTE — ED Notes (Signed)
Patient transported to X-ray 

## 2015-04-07 NOTE — Progress Notes (Signed)
ANTICOAGULATION CONSULT NOTE - Initial Consult  Pharmacy Consult for heparin Indication: pulmonary embolus  No Known Allergies  Patient Measurements: Height: 5\' 10"  (177.8 cm) Weight: 260 lb (117.935 kg) IBW/kg (Calculated) : 73 Heparin Dosing Weight: 99 kg  Vital Signs: Temp: 99.8 F (37.7 C) (09/22 0926) Temp Source: Oral (09/22 0926) BP: 155/70 mmHg (09/22 0930) Pulse Rate: 99 (09/22 0930)  Labs:  Recent Labs  04/07/15 0735 04/07/15 0736  HGB  --  12.9*  HCT  --  39.6  PLT  --  239  CREATININE 1.13  --     Estimated Creatinine Clearance: 106.3 mL/min (by C-G formula based on Cr of 1.13).   Medical History: Past Medical History  Diagnosis Date  . Hypercholesteremia   . Pulmonary emboli     Assessment: Patient's a 44 y.o M with hx idiopathic PE on xarelto in the past. Dr. Antonieta Pert note in June 2015 indicated that he was treated with xarelto for 2 years and completed course in July 2015.  He presented to the ED on 9/22 with c/o CP and SOB.  Chest CTA showed "recurrent thromboembolic disease on the right. Some of the emboli are peripheral and likely subacute in age." To start heparin for PE.  Patient reported not being on any anticoag. PTA and no bleeding issues or recent surgical procedures.  Goal of Therapy:  Heparin level 0.3-0.7 units/ml Monitor platelets by anticoagulation protocol: Yes   Plan:  - heparin 3000 units IV bolus, then drip at 1800 units/hr (per [place] method) - check 6 hour heparin level - monitor for s/s bleeding  Pham, Anh P 04/07/2015,9:47 AM

## 2015-04-08 ENCOUNTER — Observation Stay (HOSPITAL_COMMUNITY): Payer: BLUE CROSS/BLUE SHIELD

## 2015-04-08 ENCOUNTER — Observation Stay (HOSPITAL_BASED_OUTPATIENT_CLINIC_OR_DEPARTMENT_OTHER): Payer: BLUE CROSS/BLUE SHIELD

## 2015-04-08 DIAGNOSIS — R0600 Dyspnea, unspecified: Secondary | ICD-10-CM | POA: Diagnosis not present

## 2015-04-08 DIAGNOSIS — E785 Hyperlipidemia, unspecified: Secondary | ICD-10-CM | POA: Diagnosis not present

## 2015-04-08 DIAGNOSIS — R091 Pleurisy: Secondary | ICD-10-CM

## 2015-04-08 DIAGNOSIS — I2699 Other pulmonary embolism without acute cor pulmonale: Secondary | ICD-10-CM | POA: Diagnosis not present

## 2015-04-08 DIAGNOSIS — G4733 Obstructive sleep apnea (adult) (pediatric): Secondary | ICD-10-CM | POA: Diagnosis not present

## 2015-04-08 LAB — URINE CULTURE: Culture: NO GROWTH

## 2015-04-08 MED ORDER — IBUPROFEN 200 MG PO TABS
400.0000 mg | ORAL_TABLET | Freq: Once | ORAL | Status: AC
  Administered 2015-04-08: 400 mg via ORAL
  Filled 2015-04-08: qty 2

## 2015-04-08 MED ORDER — HYDROCODONE-ACETAMINOPHEN 5-325 MG PO TABS
1.0000 | ORAL_TABLET | ORAL | Status: DC | PRN
  Administered 2015-04-08 – 2015-04-09 (×2): 1 via ORAL
  Filled 2015-04-08 (×2): qty 1

## 2015-04-08 MED ORDER — ACETAMINOPHEN 325 MG PO TABS
650.0000 mg | ORAL_TABLET | Freq: Three times a day (TID) | ORAL | Status: DC
  Administered 2015-04-08 – 2015-04-10 (×6): 650 mg via ORAL
  Filled 2015-04-08 (×8): qty 2

## 2015-04-08 NOTE — Progress Notes (Signed)
Patient has home CPAP and can place self on and off. RT told to call if needs any assistance.

## 2015-04-08 NOTE — Progress Notes (Signed)
PROGRESS NOTE    [name] [name] IDP:[id number] DOB: 04-12-1971 DOA: 04/07/2015 PCP:  Melinda Crutch, MD  HPI/Brief narrative 44 y.o. 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 CTA chest confirmed recurrent thromboembolic disease on the right. He was hemodynamically stable. Admitted to telemetry for further evaluation and management.   Assessment/Plan:  Acute pulmonary embolism, recurrent - Strong personal & family history of VTE - Patient gives prior history of PE 3-4 years ago and completed approximately 1 year course of Xarelto - Discussed with radiology on 9/22: Mild-to-moderate clot burden and no evidence of right heart strain - Hemodynamically stable. Had been empirically started on IV heparin in ED. - DC'ed IV heparin and started Xarelto on 9/22-will have to remain on this lifelong unless develops contraindications. No bleeding complications reported. - May need outpatient evaluation by hematology. - 2-D echo results as below: No right heart strain on echo or CTA chest -Significant pleurisy.  Pleurisy from acute PE - Repeat chest x-ray - We'll place on scheduled Tylenol. If this does not control pain then trial of opioids. This is currently the barium for discharge.  HLD - Not on medications at home  OSA - Recently diagnosed a month ago. Continue nightly CPAP  Elevated blood pressure - May be related to acute PE/pain versus hypertension. Pain control and monitor.   DVT prophylaxis. Patient being therapeutically anticoagulated Code Status: Full  Family Communication: None at bedside  Disposition Plan: Possible DC 9/24    Consultants:  None   Procedures:  2-D echo  04/08/15: Study Conclusions  - Left ventricle: The cavity size was normal. Systolic function was normal. The estimated ejection fraction was in the range of 60% to 65%. Wall motion was normal; there were no regional wall motion abnormalities. The tissue Doppler parameters were normal. Left ventricular diastolic function parameters were normal. - Aortic root: The aortic root was normal in size. - Mitral valve: Structurally normal valve. - Left atrium: The atrium was normal in size. - Right ventricle: The cavity size was normal. Wall thickness was normal. Systolic function was normal. - Right atrium: The atrium was normal in size. - Tricuspid valve: There was mild regurgitation. - Pulmonary arteries: The main pulmonary artery was normal-sized. Systolic pressure was mildly increased. PA peak pressure: 42 mm Hg (S). - Inferior vena cava: The vessel was normal in size. - Pericardium, extracardiac: A trivial pericardial effusion was identified. Features were not consistent with tamponade physiology.  Impressions:  - Normal biventricular size and function. Normal filling pressures. Mild pulmonary hypertension suggestive of underlying lung disease.  Antibiotics:  None  Subjective: No significant change in symptoms. Still complaining of DOE on minimal exertion. Complains of right mid back pain, worse with deep inspiration and even having difficulty turning from same.   Objective: Filed Vitals:   04/07/15 1200 04/07/15 1233 04/07/15 2138 04/08/15 0540  BP: 151/82 146/95 160/91 167/98  Pulse: 91 89 109 104  Temp:  98.3 F (36.8 C) 99.3 F (37.4 C) 100 F (37.8 C)  TempSrc:  Oral Oral Oral  Resp: 25 22 22 22   Height:      Weight:      SpO2: 95% 96% 93% 93%  Intake/Output Summary (Last 24 hours) at 04/08/15 1338 Last data filed at 04/07/15 1415  Gross per 24 hour  Intake    240 ml  Output      0 ml  Net    240 ml   Filed Weights   04/07/15 2751   Weight: 117.935 kg (260 lb)     Exam:  General exam: Moderately built and overweight male patient lying comfortably supine in bed.  Respiratory system: Reduced breath sounds in right base otherwise clear to auscultation. No pleural rub appreciated. No increased work of breathing. No chest wall tenderness. Cardiovascular system: S1 & S2 heard, RRR. No JVD, murmurs, gallops, clicks or pedal edema. Telemetry: Sinus rhythm-sinus tachycardia in the 100s.  Gastrointestinal system: Abdomen is nondistended, soft and nontender. Normal bowel sounds heard. Central nervous system: Alert and oriented. No focal neurological deficits. Extremities: Symmetric 5 x 5 power.   Data Reviewed: Basic Metabolic Panel:  Recent Labs Lab 04/07/15 0735  NA 139  K 3.9  CL 105  CO2 25  GLUCOSE 111*  BUN 17  CREATININE 1.13  CALCIUM 9.5   Liver Function Tests:  Recent Labs Lab 04/07/15 0735  AST 27  ALT 20  ALKPHOS 79  BILITOT 0.5  PROT 7.7  ALBUMIN 4.2   No results for input(s): LIPASE, AMYLASE in the last 168 hours. No results for input(s): AMMONIA in the last 168 hours. CBC:  Recent Labs Lab 04/07/15 0736  WBC 8.9  NEUTROABS 5.1  HGB 12.9*  HCT 39.6  MCV 68.5*  PLT 239   Cardiac Enzymes: No results for input(s): CKTOTAL, CKMB, CKMBINDEX, TROPONINI in the last 168 hours. BNP (last 3 results) No results for input(s): PROBNP in the last 8760 hours. CBG: No results for input(s): GLUCAP in the last 168 hours.  Recent Results (from the past 240 hour(s))  Urine culture     Status: None   Collection Time: 04/07/15 10:11 AM  Result Value Ref Range Status   Specimen Description URINE, CLEAN CATCH  Final   Special Requests NONE  Final   Culture   Final    NO GROWTH 1 DAY Performed at [facility]    Report Status 04/08/2015 FINAL  Final           Studies: Dg Chest 2 View  04/07/2015   CLINICAL DATA:  History of PE 3 years ago.  Pain RIGHT chest.  EXAM: CHEST  2  VIEW  COMPARISON:  None.  FINDINGS: Normal cardiac silhouette. There is linear opacities projecting over the LEFT and RIGHT hemidiaphragm which are not significant changed from comparison exams. Findings suggest chronic scarring or atelectasis. There are low lung volumes. On the lateral projection there is increased density over the heart concerning for RIGHT middle lobe pneumonia. No pneumothorax.  IMPRESSION: 1. Increased density over the heart on the lateral projection could represent RIGHT middle lobe pneumonia. Recommend correlation for pulmonary infection. 2. Chronic scarring or atelectasis over the LEFT and RIGHT hemidiaphragm.   Electronically Signed   By: Suzy Bouchard M.D.   On: 04/07/2015 07:20   Ct Angio Chest Pe W/cm &/or Wo Cm  04/07/2015   CLINICAL DATA:  Intermittent right chest pain for last 3 days with difficulty taking deep breaths. History of pulmonary embolism. Initial encounter.  EXAM: CT ANGIOGRAPHY CHEST WITH CONTRAST  TECHNIQUE: Multidetector CT imaging of the chest was performed using the standard protocol during bolus administration of intravenous contrast. Multiplanar CT image reconstructions and MIPs were obtained  to evaluate the vascular anatomy.  CONTRAST:  129mL OMNIPAQUE IOHEXOL 350 MG/ML SOLN  COMPARISON:  Chest CT 03/28/2012.  FINDINGS: Mediastinum: The pulmonary arteries are adequately, but not optimally opacified with contrast. There is recurrent thromboembolic disease on the right. There is a filling defect in the descending right lower lobe pulmonary artery. There are peripheral filling defects within the segmental right pulmonary arteries. The main pulmonary arteries appear normal. No left-sided filling defects demonstrated. No other significant vascular findings. The heart size is normal. There are no signs of right ventricular strain. There are no enlarged mediastinal, hilar or axillary lymph nodes. The thyroid gland, trachea and esophagus demonstrate no significant  findings.  Lungs/Pleura: There is trace pleural fluid on the right.There are new patchy bibasilar airspace opacities, worse on the right. There is no focal consolidation to suggest infection or infarct.  Upper abdomen: Unremarkable.  There is no adrenal mass.  Musculoskeletal/Chest wall: No chest wall lesion or acute osseous findings.  Review of the MIP images confirms the above findings.  IMPRESSION: 1. Recurrent thromboembolic disease on the right. Some of the emboli are peripheral and likely subacute in age. 2. Associated patchy bibasilar atelectasis. Trace pleural fluid on the right. 3. Critical Value/emergent results were called by telephone at the time of interpretation on 04/07/2015 at 9:30 am to Dr. Varney Biles, who verbally acknowledged these results.   Electronically Signed   By: Richardean Sale M.D.   On: 04/07/2015 09:34        Scheduled Meds: . acetaminophen  650 mg Oral TID  . multivitamin with minerals  1 tablet Oral Daily  . Rivaroxaban  15 mg Oral BID WC  . sodium chloride  3 mL Intravenous Q12H   Continuous Infusions:   Principal Problem:   Recurrent pulmonary embolism Active Problems:   Prediabetes   Hyperlipidemia   OSA on CPAP   PE (pulmonary embolism)    Time spent: 30 minutes.    Vernell Leep, MD, FACP, FHM. [facility] Pager [phone]  If 7PM-7AM, please contact night-coverage [email or web address] Password [email or web address] 04/08/2015, 1:38 PM    LOS: 1 day

## 2015-04-08 NOTE — Progress Notes (Signed)
Pt's temp has been steadily rising this shift. Pt reporting upper abdominal pain during this shift that has increased slowly and had intermittent sharp pains. Pt has been given tylenol which has kept the pain below a 5 for now. BP has increased with increasing pain and HR has increased with it. Will continue to monitor.

## 2015-04-08 NOTE — Progress Notes (Signed)
  Echocardiogram 2D Echocardiogram has been performed.  Darlina Sicilian M 04/08/2015, 8:55 AM

## 2015-04-09 DIAGNOSIS — R091 Pleurisy: Secondary | ICD-10-CM | POA: Diagnosis not present

## 2015-04-09 DIAGNOSIS — G4733 Obstructive sleep apnea (adult) (pediatric): Secondary | ICD-10-CM | POA: Diagnosis not present

## 2015-04-09 DIAGNOSIS — I2699 Other pulmonary embolism without acute cor pulmonale: Secondary | ICD-10-CM | POA: Diagnosis not present

## 2015-04-09 NOTE — Progress Notes (Signed)
Patient has home CPAP and can place self on and off. RT told patient to call if needs any assistance.

## 2015-04-09 NOTE — Progress Notes (Signed)
PROGRESS NOTE    [name] [name] DEY:[id number] DOB: [birth year] DOA: 04/07/2015 PCP:  Melinda Crutch, MD  HPI/Brief narrative 44 y.o. male, lives with male partner, independent of activities of daily living and works as a Freight forwarder, [place] of PE 3 years ago and completed approximately one year of [place], [place] and newly diagnosed OSA on CPAP, presented to the [facility] ED on 04/07/15 with complaints of dyspnea on exertion, chest pain and right lower back pain. CTA chest confirmed recurrent thromboembolic disease on the right. He was hemodynamically stable. Admitted to telemetry for further evaluation and management.   Assessment/Plan:  Acute pulmonary embolism, recurrent - Strong personal & family history of VTE - Patient gives prior history of PE 3-4 years ago and completed approximately 1 year course of Xarelto - Discussed with radiology on 9/22: Mild-to-moderate clot burden and no evidence of right heart strain - Hemodynamically stable. Had been empirically started on IV heparin in ED. - DC'ed IV heparin and started Xarelto on 9/22-will have to remain on this lifelong unless develops contraindications. No bleeding complications reported. - May need outpatient evaluation by hematology. - 2-D echo results as below: No right heart strain on echo or CTA chest -Significant pleurisy-improved.  Pleurisy/? Pulmonary infarction, from acute PE - Placed on scheduled Tylenol. Pain much improved. - Chest x-ray 9/23 shows bibasal atelectasis right >left but could also represent element of pulmonary infarction on right side - Incentive spirometry.  HLD - Not on medications at home  OSA - Recently diagnosed a month ago. Continue nightly CPAP  Elevated blood pressure - May be related to acute PE/pain versus hypertension. Pain control and monitor.  Fever - No symptoms suggestive of infectious etiology. Could be secondary to pulmonary infarction/PE. Chest x-ray without findings  suggestive of pneumonia. Urine microscopy negative. Blood cultures pending. - Supportive treatment and monitor.    DVT prophylaxis. Patient being therapeutically anticoagulated Code Status: Full  Family Communication: None at bedside  Disposition Plan: Possible DC 9/25    Consultants:  None   Procedures:  2-D echo 04/08/15: Study Conclusions  - Left ventricle: The cavity size was normal. Systolic function was normal. The estimated ejection fraction was in the range of 60% to 65%. Wall motion was normal; there were no regional wall motion abnormalities. The tissue Doppler parameters were normal. Left ventricular diastolic function parameters were normal. - Aortic root: The aortic root was normal in size. - Mitral valve: Structurally normal valve. - Left atrium: The atrium was normal in size. - Right ventricle: The cavity size was normal. Wall thickness was normal. Systolic function was normal. - Right atrium: The atrium was normal in size. - Tricuspid valve: There was mild regurgitation. - Pulmonary arteries: The main pulmonary artery was normal-sized. Systolic pressure was mildly increased. PA peak pressure: 42 mm Hg (S). - Inferior vena cava: The vessel was normal in size. - Pericardium, extracardiac: A trivial pericardial effusion was identified. Features were not consistent with tamponade physiology.  Impressions:  - Normal biventricular size and function. Normal filling pressures. Mild pulmonary hypertension suggestive of underlying lung disease.  Antibiotics:  None  Subjective: Feels a whole lot better. Right sided mid back pain almost 50% better and rates pain as intermittent 4/10 in severity. Able to ambulate comfortably to the bathroom.  Objective: Filed Vitals:   04/08/15 2138 04/08/15 2353 04/09/15 0420 04/09/15 1349  BP: 166/92  143/92 158/88  Pulse: 109  80 112  Temp: 102.3 F (39.1 C) 98.4 F (36.9  C) 98.5 F (36.9  C) 99.5 F (37.5 C)  TempSrc: Oral Oral Oral Oral  Resp: 20  18 20   Height:      Weight:      SpO2: 92%  94% 92%   No intake or output data in the 24 hours ending 04/09/15 1351 Filed Weights   04/07/15 6237  Weight: 117.935 kg (260 lb)     Exam:  General exam: Moderately built and overweight male patient sitting up comfortably at edge of bed eating breakfast. Had just returned from the bathroom. Respiratory system: Reduced breath sounds in right base otherwise clear to auscultation. No pleural rub appreciated. No increased work of breathing. No chest wall tenderness. Cardiovascular system: S1 & S2 heard, RRR. No JVD, murmurs, gallops, clicks or pedal edema. Telemetry: Sinus rhythm.  Gastrointestinal system: Abdomen is nondistended, soft and nontender. Normal bowel sounds heard. Central nervous system: Alert and oriented. No focal neurological deficits. Extremities: Symmetric 5 x 5 power.   Data Reviewed: Basic Metabolic Panel:  Recent Labs Lab 04/07/15 0735  NA 139  K 3.9  CL 105  CO2 25  GLUCOSE 111*  BUN 17  CREATININE 1.13  CALCIUM 9.5   Liver Function Tests:  Recent Labs Lab 04/07/15 0735  AST 27  ALT 20  ALKPHOS 79  BILITOT 0.5  PROT 7.7  ALBUMIN 4.2   No results for input(s): LIPASE, AMYLASE in the last 168 hours. No results for input(s): AMMONIA in the last 168 hours. CBC:  Recent Labs Lab 04/07/15 0736  WBC 8.9  NEUTROABS 5.1  HGB 12.9*  HCT 39.6  MCV 68.5*  PLT 239   Cardiac Enzymes: No results for input(s): CKTOTAL, CKMB, CKMBINDEX, TROPONINI in the last 168 hours. BNP (last 3 results) No results for input(s): PROBNP in the last 8760 hours. CBG: No results for input(s): GLUCAP in the last 168 hours.  Recent Results (from the past 240 hour(s))  Blood Culture (routine x 2)     Status: None (Preliminary result)   Collection Time: 04/07/15  7:46 AM  Result Value Ref Range Status   Specimen Description BLOOD LEFT ANTECUBITAL  Final    Special Requests BOTTLES DRAWN AEROBIC AND ANAEROBIC 9ML  Final   Culture   Final    NO GROWTH 2 DAYS Performed at [facility]    Report Status PENDING  Incomplete  Blood Culture (routine x 2)     Status: None (Preliminary result)   Collection Time: 04/07/15  8:01 AM  Result Value Ref Range Status   Specimen Description BLOOD RIGHT ANTECUBITAL  Final   Special Requests BOTTLES DRAWN AEROBIC AND ANAEROBIC 5ML  Final   Culture   Final    NO GROWTH 2 DAYS Performed at [facility]    Report Status PENDING  Incomplete  Urine culture     Status: None   Collection Time: 04/07/15 10:11 AM  Result Value Ref Range Status   Specimen Description URINE, CLEAN CATCH  Final   Special Requests NONE  Final   Culture   Final    NO GROWTH 1 DAY Performed at [facility]    Report Status 04/08/2015 FINAL  Final           Studies: Dg Chest 2 View  04/08/2015   CLINICAL DATA:  Presented to ED on 04/07/15 with complaints of dyspnea on exertion, chest pain and right lower back pain. CTA chest confirmed recurrent thromboembolic disease on the right.  EXAM: CHEST  2 VIEW  COMPARISON:  CT, 04/07/2015.  FINDINGS: There is bilateral, right greater left, lung base opacity most likely atelectasis. No pulmonary edema. No convincing pleural effusion and no pneumothorax.  Heart, mediastinum and hila are unremarkable.  Skeletal structures are unremarkable.  IMPRESSION: 1. Right greater than left lung base opacity likely due to atelectasis. Component of pulmonary infarction is possible. 2. No pulmonary edema. No convincing pleural effusion and no pneumothorax.   Electronically Signed   By: Lajean Manes M.D.   On: 04/08/2015 14:51        Scheduled Meds: . acetaminophen  650 mg Oral TID  . multivitamin with minerals  1 tablet Oral Daily  . Rivaroxaban  15 mg Oral BID WC  . sodium chloride  3 mL Intravenous Q12H   Continuous Infusions:   Principal Problem:   Recurrent pulmonary  embolism Active Problems:   Prediabetes   Hyperlipidemia   OSA on CPAP   PE (pulmonary embolism)    Time spent: 30 minutes.    Vernell Leep, MD, FACP, FHM. [facility] Pager [phone]  If 7PM-7AM, please contact night-coverage [email or web address] Password [email or web address] 04/09/2015, 1:51 PM    LOS: 2 days

## 2015-04-10 DIAGNOSIS — E785 Hyperlipidemia, unspecified: Secondary | ICD-10-CM | POA: Diagnosis not present

## 2015-04-10 DIAGNOSIS — I2699 Other pulmonary embolism without acute cor pulmonale: Secondary | ICD-10-CM | POA: Diagnosis not present

## 2015-04-10 DIAGNOSIS — R091 Pleurisy: Secondary | ICD-10-CM | POA: Diagnosis not present

## 2015-04-10 DIAGNOSIS — G4733 Obstructive sleep apnea (adult) (pediatric): Secondary | ICD-10-CM | POA: Diagnosis not present

## 2015-04-10 MED ORDER — BISACODYL 5 MG PO TBEC
10.0000 mg | DELAYED_RELEASE_TABLET | Freq: Every day | ORAL | Status: DC | PRN
  Administered 2015-04-10: 10 mg via ORAL
  Filled 2015-04-10: qty 2

## 2015-04-10 MED ORDER — ACETAMINOPHEN 325 MG PO TABS
650.0000 mg | ORAL_TABLET | Freq: Four times a day (QID) | ORAL | Status: DC | PRN
Start: 2015-04-10 — End: 2018-05-11

## 2015-04-10 MED ORDER — RIVAROXABAN (XARELTO) VTE STARTER PACK (15 & 20 MG): ORAL_TABLET | ORAL | Status: DC

## 2015-04-10 NOTE — Discharge Summary (Signed)
Physician Discharge Summary  [name] [name] MPN:[id number] DOB: 1971/01/27 DOA: 04/07/2015  PCP:  Melinda Crutch, MD  Admit date: 04/07/2015 Discharge date: 04/10/2015  Time spent: Greater than 30 minutes  Recommendations for Outpatient Follow-up:  1. Dr. Lawerance Cruel, PCP in 3 days. Please follow final blood culture results that were sent from the hospital. 2. Recommend outpatient hematology consultation for further evaluation of recurrent venous thromboembolism.  Discharge Diagnoses:  Principal Problem:   Recurrent pulmonary embolism Active Problems:   Prediabetes   Hyperlipidemia   OSA on CPAP   PE (pulmonary embolism)   Discharge Condition: Improved & Stable  Diet recommendation: Heart healthy diet  Filed Weights   04/07/15 0638  Weight: 117.935 kg (260 lb)    History of present illness:  44 y.o. 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 CTA chest confirmed recurrent thromboembolic disease on the right. He was hemodynamically stable. Admitted to telemetry for further evaluation and management.  Hospital Course:   Acute pulmonary embolism, recurrent - Strong personal & family history of VTE - Patient gives prior history of PE 3-4 years ago and completed approximately 1 year course of Xarelto - Discussed with radiology on 9/22: Mild-to-moderate clot burden and no evidence of right heart strain - Hemodynamically stable. Had been empirically started on IV heparin in ED. - DC'ed IV heparin and started Xarelto on 9/22-will have to remain on this lifelong unless develops contraindications. No bleeding complications reported. - May need outpatient evaluation by hematology. - 2-D  echo results as below: No right heart strain on echo or CTA chest -Significant pleurisy-improved. - ED case management has already discussed with patient about how he supposed to pick up his Xarelto. Patient has been advised to discard the first 7 tablets from the starter pack (has received 7 doses thus far) and proceed from tablet #8 this evening.   Pleurisy/? Pulmonary infarction, from acute PE - Placed on scheduled Tylenol. Pain much improved. - Chest x-ray 9/23 shows bibasal atelectasis right >left but could also represent element of pulmonary infarction on right side - Incentive spirometry. - Much improved. Continue when necessary Tylenol. Advised not to use NSAID medications due to risk of   HLD - Not on medications at home  OSA - Recently diagnosed a month ago. Continue nightly CPAP  Elevated blood pressure - May be related to acute PE/pain versus hypertension. Pain control and monitor. Better. Outpatient follow-up.  Fever - No symptoms suggestive of infectious etiology. Could be secondary to pulmonary infarction/PE. Chest x-ray without findings suggestive of pneumonia. Urine microscopy negative. Blood cultures negative to date. - Still has occasional spikes of fever but no other symptomatology to suggest source. Discussed with infectious disease M.D. on call and agree that his fevers may be related to pulmonary infarction from PE and agree with monitoring off of antibiotics and treating supportively. Close outpatient follow-up with PCP.   Consultants:  None  Procedures:  2-D echo 04/08/15: Study Conclusions  - Left ventricle: The cavity size was normal. Systolic function was normal. The estimated ejection fraction was in the range of 60% to 65%. Wall motion was normal; there were no regional wall motion abnormalities. The tissue Doppler parameters were normal.  Left ventricular diastolic function parameters were normal. - Aortic root: The aortic root was normal  in size. - Mitral valve: Structurally normal valve. - Left atrium: The atrium was normal in size. - Right ventricle: The cavity size was normal. Wall thickness was normal. Systolic function was normal. - Right atrium: The atrium was normal in size. - Tricuspid valve: There was mild regurgitation. - Pulmonary arteries: The main pulmonary artery was normal-sized. Systolic pressure was mildly increased. PA peak pressure: 42 mm Hg (S). - Inferior vena cava: The vessel was normal in size. - Pericardium, extracardiac: A trivial pericardial effusion was identified. Features were not consistent with tamponade physiology.  Impressions:  - Normal biventricular size and function. Normal filling pressures. Mild pulmonary hypertension suggestive of underlying lung disease.  Antibiotics:  None  Discharge Exam:  Complaints: Overall feels much better. States that his right-sided mid back pain has almost resolved. Had BM after suppository yesterday.   Filed Vitals:   04/09/15 2051 04/09/15 2150 04/10/15 0505 04/10/15 0656  BP: 143/77  137/88   Pulse: 113  103   Temp: 102.9 F (39.4 C) 100 F (37.8 C) 100.1 F (37.8 C) 100.8 F (38.2 C)  TempSrc: Oral Oral Oral Oral  Resp: 18  16   Height:      Weight:      SpO2: 100%  94%     General exam: Moderately built and overweight male patient sitting up comfortably at edge of bed. Respiratory system: Reduced breath sounds in right base otherwise clear to auscultation. No pleural rub appreciated. No increased work of breathing. No chest wall tenderness. Cardiovascular system: S1 & S2 heard, RRR. No JVD, murmurs, gallops, clicks or pedal edema.  Gastrointestinal system: Abdomen is nondistended, soft and nontender. Normal bowel sounds heard. Central nervous system: Alert and oriented. No focal neurological deficits. Extremities: Symmetric 5 x 5 power.  Discharge Instructions      Discharge Instructions    Call MD for:   difficulty breathing, headache or visual disturbances    Complete by:  As directed      Call MD for:  extreme fatigue    Complete by:  As directed      Call MD for:  persistant dizziness or light-headedness    Complete by:  As directed      Call MD for:  persistant nausea and vomiting    Complete by:  As directed      Call MD for:  severe uncontrolled pain    Complete by:  As directed      Call MD for:  temperature >100.4    Complete by:  As directed      Diet - low sodium heart healthy    Complete by:  As directed      Discharge instructions    Complete by:  As directed   Continue to use the incentive spirometer as directed.     Increase activity slowly    Complete by:  As directed             Medication List    STOP taking these medications        ADVIL 200 MG tablet  Generic drug:  ibuprofen      TAKE these medications        acetaminophen 325 MG tablet  Commonly known as:  TYLENOL  Take 2 tablets (650 mg total) by mouth every 6 (six) hours as needed for mild pain, moderate pain, fever or headache (or Fever >/=  101).     multivitamin with minerals Tabs tablet  Take 1 tablet by mouth daily.     Rivaroxaban 15 & 20 MG Tbpk  Commonly known as:  XARELTO STARTER PACK  Take as directed on package: Start with one 15mg  tablet by mouth twice a day with food. On Day 22, switch to one 20mg  tablet once a day with food.       Follow-up Information    Follow up with [facility] . Call on 04/08/2015.   Why:  [phone] Your 90 day supply cost will be $100   Contact information:   This is your mail order provider for your [employer] PPO plan Call them to set up your mail order 90 day supply of medications after you have activated your free trial xarelto starter card to get your initial doses @ your preferred retail pharmacy      Follow up with  Melinda Crutch, MD. Schedule an appointment as soon as possible for a visit in 3 days.   Specialty:  Family Medicine   Contact  information:   [place] [address] [phone]        The results of significant diagnostics from this hospitalization (including imaging, microbiology, ancillary and laboratory) are listed below for reference.    Significant Diagnostic Studies: Dg Chest 2 View  04/08/2015   CLINICAL DATA:  Presented to ED on 04/07/15 with complaints of dyspnea on exertion, chest pain and right lower back pain. CTA chest confirmed recurrent thromboembolic disease on the right.  EXAM: CHEST  2 VIEW  COMPARISON:  CT, 04/07/2015.  FINDINGS: There is bilateral, right greater left, lung base opacity most likely atelectasis. No pulmonary edema. No convincing pleural effusion and no pneumothorax.  Heart, mediastinum and hila are unremarkable.  Skeletal structures are unremarkable.  IMPRESSION: 1. Right greater than left lung base opacity likely due to atelectasis. Component of pulmonary infarction is possible. 2. No pulmonary edema. No convincing pleural effusion and no pneumothorax.   Electronically Signed   By: Lajean Manes M.D.   On: 04/08/2015 14:51   Dg Chest 2 View  04/07/2015   CLINICAL DATA:  History of PE 3 years ago.  Pain RIGHT chest.  EXAM: CHEST  2 VIEW  COMPARISON:  None.  FINDINGS: Normal cardiac silhouette. There is linear opacities projecting over the LEFT and RIGHT hemidiaphragm which are not significant changed from comparison exams. Findings suggest chronic scarring or atelectasis. There are low lung volumes. On the lateral projection there is increased density over the heart concerning for RIGHT middle lobe pneumonia. No pneumothorax.  IMPRESSION: 1. Increased density over the heart on the lateral projection could represent RIGHT middle lobe pneumonia. Recommend correlation for pulmonary infection. 2. Chronic scarring or atelectasis over the LEFT and RIGHT hemidiaphragm.   Electronically Signed   By: Suzy Bouchard M.D.   On: 04/07/2015 07:20   Ct Angio Chest Pe W/cm &/or Wo  Cm  04/07/2015   CLINICAL DATA:  Intermittent right chest pain for last 3 days with difficulty taking deep breaths. History of pulmonary embolism. Initial encounter.  EXAM: CT ANGIOGRAPHY CHEST WITH CONTRAST  TECHNIQUE: Multidetector CT imaging of the chest was performed using the standard protocol during bolus administration of intravenous contrast. Multiplanar CT image reconstructions and MIPs were obtained to evaluate the vascular anatomy.  CONTRAST:  169mL OMNIPAQUE IOHEXOL 350 MG/ML SOLN  COMPARISON:  Chest CT 03/28/2012.  FINDINGS: Mediastinum: The pulmonary arteries are adequately,  but not optimally opacified with contrast. There is recurrent thromboembolic disease on the right. There is a filling defect in the descending right lower lobe pulmonary artery. There are peripheral filling defects within the segmental right pulmonary arteries. The main pulmonary arteries appear normal. No left-sided filling defects demonstrated. No other significant vascular findings. The heart size is normal. There are no signs of right ventricular strain. There are no enlarged mediastinal, hilar or axillary lymph nodes. The thyroid gland, trachea and esophagus demonstrate no significant findings.  Lungs/Pleura: There is trace pleural fluid on the right.There are new patchy bibasilar airspace opacities, worse on the right. There is no focal consolidation to suggest infection or infarct.  Upper abdomen: Unremarkable.  There is no adrenal mass.  Musculoskeletal/Chest wall: No chest wall lesion or acute osseous findings.  Review of the MIP images confirms the above findings.  IMPRESSION: 1. Recurrent thromboembolic disease on the right. Some of the emboli are peripheral and likely subacute in age. 2. Associated patchy bibasilar atelectasis. Trace pleural fluid on the right. 3. Critical Value/emergent results were called by telephone at the time of interpretation on 04/07/2015 at 9:30 am to Dr. Varney Biles, who verbally  acknowledged these results.   Electronically Signed   By: Richardean Sale M.D.   On: 04/07/2015 09:34    Microbiology: Recent Results (from the past 240 hour(s))  Blood Culture (routine x 2)     Status: None (Preliminary result)   Collection Time: 04/07/15  7:46 AM  Result Value Ref Range Status   Specimen Description BLOOD LEFT ANTECUBITAL  Final   Special Requests BOTTLES DRAWN AEROBIC AND ANAEROBIC 9ML  Final   Culture   Final    NO GROWTH 2 DAYS Performed at [facility]    Report Status PENDING  Incomplete  Blood Culture (routine x 2)     Status: None (Preliminary result)   Collection Time: 04/07/15  8:01 AM  Result Value Ref Range Status   Specimen Description BLOOD RIGHT ANTECUBITAL  Final   Special Requests BOTTLES DRAWN AEROBIC AND ANAEROBIC 5ML  Final   Culture   Final    NO GROWTH 2 DAYS Performed at [facility]    Report Status PENDING  Incomplete  Urine culture     Status: None   Collection Time: 04/07/15 10:11 AM  Result Value Ref Range Status   Specimen Description URINE, CLEAN CATCH  Final   Special Requests NONE  Final   Culture   Final    NO GROWTH 1 DAY Performed at [facility]    Report Status 04/08/2015 FINAL  Final     Labs: Basic Metabolic Panel:  Recent Labs Lab 04/07/15 0735  NA 139  K 3.9  CL 105  CO2 25  GLUCOSE 111*  BUN 17  CREATININE 1.13  CALCIUM 9.5   Liver Function Tests:  Recent Labs Lab 04/07/15 0735  AST 27  ALT 20  ALKPHOS 79  BILITOT 0.5  PROT 7.7  ALBUMIN 4.2   No results for input(s): LIPASE, AMYLASE in the last 168 hours. No results for input(s): AMMONIA in the last 168 hours. CBC:  Recent Labs Lab 04/07/15 0736  WBC 8.9  NEUTROABS 5.1  HGB 12.9*  HCT 39.6  MCV 68.5*  PLT 239   Cardiac Enzymes: No results for input(s): CKTOTAL, CKMB, CKMBINDEX, TROPONINI in the last 168 hours. BNP: BNP (last 3 results) No results for input(s): BNP in the last 8760 hours.  ProBNP (last 3  results) No results for input(s): PROBNP in the last 8760 hours.  CBG: No results for input(s): GLUCAP in the last 168 hours.    Signed:  Vernell Leep, MD, FACP, FHM. [facility] Pager [phone]  If 7PM-7AM, please contact night-coverage [email or web address] Password [facility] 04/10/2015, 11:16 AM

## 2015-04-10 NOTE — Plan of Care (Signed)
Problem: ICU Phase Progression Outcomes Goal: Flu/PneumoVaccines if indicated Outcome: Not Met (add Reason) Pt refused  Problem: Phase I Progression Outcomes Goal: Flu/PneumoVaccines if indicated Outcome: Not Met (add Reason) Pt refused  Problem: Discharge Progression Outcomes Goal: Flu vaccine received if indicated Outcome: Not Met (add Reason) Pt refused Goal: Pneumonia vaccine received if indicated Outcome: Not Met (add Reason) Pt refused

## 2015-04-10 NOTE — Progress Notes (Signed)
Discharge teaching given to pt including activity, diet, medications, and follow-up appts. Pt verbalized understanding of all discharge instructions. IV access was d/c'd. Vitals are stable. Pt escorted out, driven home by family. [place]

## 2015-04-10 NOTE — Progress Notes (Addendum)
12:23pm Received call from pt's nurse (Effie x [phone]) states patient is being d/c'd today and needs assistance with Xarelto.    Chart reviewed.   Per 04/06/15 CM progress note, patient has already received information on the free month supply and his insurance coverage.   Spoke with patient confirmed he has received the [employer] information and has no additional CM needs.   Lewis Moccasin, of my conversation with patient and no additional CM needs.   Darlene H. Annia Friendly, BSN, [place], Case Management

## 2015-04-12 LAB — CULTURE, BLOOD (ROUTINE X 2)
Culture: NO GROWTH
Culture: NO GROWTH

## 2015-12-01 DIAGNOSIS — Z0000 Encounter for general adult medical examination without abnormal findings: Secondary | ICD-10-CM | POA: Diagnosis not present

## 2016-06-28 DIAGNOSIS — G4733 Obstructive sleep apnea (adult) (pediatric): Secondary | ICD-10-CM | POA: Diagnosis not present

## 2016-09-21 DIAGNOSIS — Z13220 Encounter for screening for lipoid disorders: Secondary | ICD-10-CM | POA: Diagnosis not present

## 2016-09-21 DIAGNOSIS — Z0000 Encounter for general adult medical examination without abnormal findings: Secondary | ICD-10-CM | POA: Diagnosis not present

## 2017-06-27 DIAGNOSIS — G4733 Obstructive sleep apnea (adult) (pediatric): Secondary | ICD-10-CM | POA: Diagnosis not present

## 2017-07-11 DIAGNOSIS — G4719 Other hypersomnia: Secondary | ICD-10-CM | POA: Diagnosis not present

## 2017-07-11 DIAGNOSIS — G4733 Obstructive sleep apnea (adult) (pediatric): Secondary | ICD-10-CM | POA: Diagnosis not present

## 2017-10-18 DIAGNOSIS — G4733 Obstructive sleep apnea (adult) (pediatric): Secondary | ICD-10-CM | POA: Diagnosis not present

## 2017-10-18 DIAGNOSIS — G4719 Other hypersomnia: Secondary | ICD-10-CM | POA: Diagnosis not present

## 2017-10-21 DIAGNOSIS — Z0000 Encounter for general adult medical examination without abnormal findings: Secondary | ICD-10-CM | POA: Diagnosis not present

## 2017-10-21 DIAGNOSIS — R079 Chest pain, unspecified: Secondary | ICD-10-CM | POA: Diagnosis not present

## 2017-10-21 DIAGNOSIS — E7800 Pure hypercholesterolemia, unspecified: Secondary | ICD-10-CM | POA: Diagnosis not present

## 2017-10-21 DIAGNOSIS — I2699 Other pulmonary embolism without acute cor pulmonale: Secondary | ICD-10-CM | POA: Diagnosis not present

## 2017-10-24 ENCOUNTER — Other Ambulatory Visit: Payer: Self-pay

## 2017-10-31 ENCOUNTER — Ambulatory Visit: Payer: BLUE CROSS/BLUE SHIELD | Admitting: Cardiology

## 2017-10-31 ENCOUNTER — Encounter: Payer: Self-pay | Admitting: Cardiology

## 2017-10-31 VITALS — BP 146/90 | HR 65 | Ht 70.0 in | Wt 270.4 lb

## 2017-10-31 DIAGNOSIS — G4733 Obstructive sleep apnea (adult) (pediatric): Secondary | ICD-10-CM

## 2017-10-31 DIAGNOSIS — R079 Chest pain, unspecified: Secondary | ICD-10-CM | POA: Diagnosis not present

## 2017-10-31 DIAGNOSIS — R0602 Shortness of breath: Secondary | ICD-10-CM

## 2017-10-31 DIAGNOSIS — E782 Mixed hyperlipidemia: Secondary | ICD-10-CM | POA: Diagnosis not present

## 2017-10-31 DIAGNOSIS — Z9989 Dependence on other enabling machines and devices: Secondary | ICD-10-CM | POA: Diagnosis not present

## 2017-10-31 DIAGNOSIS — Z86711 Personal history of pulmonary embolism: Secondary | ICD-10-CM

## 2017-10-31 DIAGNOSIS — I2699 Other pulmonary embolism without acute cor pulmonale: Secondary | ICD-10-CM

## 2017-10-31 DIAGNOSIS — Z1329 Encounter for screening for other suspected endocrine disorder: Secondary | ICD-10-CM

## 2017-10-31 NOTE — Progress Notes (Signed)
Cardiology Office Note:    Date:  10/31/2017   ID:  [name] [name], DOB 08-03-1970, MRN [id number]  PCP:  Lawerance Cruel, MD  Cardiologist:  Jenean Lindau, MD   Referring MD: Lawerance Cruel, MD    ASSESSMENT:    1. Chest pain, unspecified type   2. SOB (shortness of breath)   3. Screening for thyroid disorder   4. History of pulmonary embolism   5. Recurrent pulmonary embolism ([place])   6. OSA on CPAP   7. Mixed hyperlipidemia    PLAN:    In order of problems listed above:  1. I discussed my findings with the patient at extensive length.  Patient's blood pressure is elevated but he tells me that generally is fine at home.  It seems like he has an element of whitecoat hypertension and is going to keep a track of it. 2. His last pulmonary embolism was more than a year ago.  In view of her symptoms I will do an echocardiogram to assess his right side of the heart.  Also he will have exercise stress Cardiolite to understand his symptoms. 3. It has been a long time since he has blood work and I will do blood work including fasting lipids. 4. Patient will be seen in follow-up appointment in 6 months or earlier if the patient has any concerns, he knows to go to the nearest emergency room for any significant concerns.   Medication Adjustments/Labs and Tests Ordered: Current medicines are reviewed at length with the patient today.  Concerns regarding medicines are outlined above.  Orders Placed This Encounter  Procedures  . Basic metabolic panel  . CBC with Differential/Platelet  . TSH  . Hepatic function panel  . Lipid panel  . MYOCARDIAL PERFUSION IMAGING  . EKG 12-Lead  . ECHOCARDIOGRAM COMPLETE   No orders of the defined types were placed in this encounter.    History of Present Illness:    [name] [name] is a 47 y.o. male who is being seen today for the evaluation of shortness of breath and chest tightness at the request of Lawerance Cruel, MD.  Patient is a  pleasant 47 year old male.  He has past medical history of pulmonary embolism on 2 occasions in the past.  For this reason he is on anticoagulation.  He mentions to me that he has been referred here for chest tightness.  He occasionally has shortness of breath.  He is a Freight forwarder.  With walking around at the job he does not have any issues.  He occasionally will have chest tightness when he is sitting in one place.  No orthopnea or PND.  No history of syncope.  At the time of my evaluation, the patient is alert awake oriented and in no distress.  Past Medical History:  Diagnosis Date  . Hypercholesteremia   . Pulmonary emboli ([place])     History reviewed. No pertinent surgical history.  Current Medications: Current Meds  Medication Sig  . Biotin 5000 MCG TABS Take 1 tablet by mouth 3 (three) times daily.     Allergies:   Patient has no known allergies.   Social History   Socioeconomic History  . Marital status: Single    Spouse name: Not on file  . Number of children: Not on file  . Years of education: Not on file  . Highest education level: Not on file  Occupational History  . Not on file  Social Needs  . Emergency planning/management officer  strain: Not on file  . Food insecurity:    Worry: Not on file    Inability: Not on file  . Transportation needs:    Medical: Not on file    Non-medical: Not on file  Tobacco Use  . Smoking status: Former Smoker    Packs/day: 0.50    Years: 20.00    Pack years: 10.00    Types: Cigarettes    Start date: 07/24/1987    Last attempt to quit: 08/24/2007    Years since quitting: 10.1  . Smokeless tobacco: Never Used  . Tobacco comment: quit smoking 6 years ago  Substance and Sexual Activity  . Alcohol use: Yes    Comment: occ  . Drug use: No  . Sexual activity: Not on file  Lifestyle  . Physical activity:    Days per week: Not on file    Minutes per session: Not on file  . Stress: Not on file  Relationships  . Social connections:    Talks on  phone: Not on file    Gets together: Not on file    Attends religious service: Not on file    Active member of club or organization: Not on file    Attends meetings of clubs or organizations: Not on file    Relationship status: Not on file  Other Topics Concern  . Not on file  Social History Narrative  . Not on file     Family History: The patient's family history includes Cancer in his mother; Hypertension in his father; Pulmonary embolism in his other and sister.  ROS:   Please see the history of present illness.    All other systems reviewed and are negative.  EKGs/Labs/Other Studies Reviewed:    The following studies were reviewed today: EKG reveals sinus rhythm and nonspecific ST-T changes.   Recent Labs: No results found for requested labs within last 8760 hours.  Recent Lipid Panel No results found for: CHOL, TRIG, HDL, CHOLHDL, VLDL, LDLCALC, LDLDIRECT  Physical Exam:    VS:  BP (!) 146/90 (BP Location: Right Arm, Patient Position: Sitting, Cuff Size: Normal)   Pulse 65   Ht 5\' 10"  (1.778 m)   Wt 270 lb 6.4 oz (122.7 kg)   SpO2 98%   BMI 38.80 kg/m     Wt Readings from Last 3 Encounters:  10/31/17 270 lb 6.4 oz (122.7 kg)  04/07/15 260 lb (117.9 kg)  12/21/13 247 lb (112 kg)     GEN: Patient is in no acute distress HEENT: Normal NECK: No JVD; No carotid bruits LYMPHATICS: No lymphadenopathy CARDIAC: S1 S2 regular, 2/6 systolic murmur at the apex. RESPIRATORY:  Clear to auscultation without rales, wheezing or rhonchi  ABDOMEN: Soft, non-tender, non-distended MUSCULOSKELETAL:  No edema; No deformity  SKIN: Warm and dry NEUROLOGIC:  Alert and oriented x 3 PSYCHIATRIC:  Normal affect    Signed, Jenean Lindau, MD  10/31/2017 9:52 AM    [place]

## 2017-10-31 NOTE — Patient Instructions (Signed)
Medication Instructions:  Your physician recommends that you continue on your current medications as directed. Please refer to the Current Medication list given to you today.  Labwork: Your physician recommends that you have the following labs drawn: CBC, BMP, TSH, liver and lipid panel.  Testing/Procedures: Your physician has requested that you have an echocardiogram. Echocardiography is a painless test that uses sound waves to create images of your heart. It provides your doctor with information about the size and shape of your heart and how well your heart's chambers and valves are working. This procedure takes approximately one hour. There are no restrictions for this procedure.  Your physician has requested that you have en exercise stress myoview. For further information please visit [email or web address]. Please follow instruction sheet, as given.   Follow-Up: Your physician recommends that you schedule a follow-up appointment in: 6 months  Any Other Special Instructions Will Be Listed Below (If Applicable).     If you need a refill on your cardiac medications before your next appointment, please call your pharmacy.   [place], RN, BSN  Echocardiogram An echocardiogram, or echocardiography, uses sound waves (ultrasound) to produce an image of your heart. The echocardiogram is simple, painless, obtained within a short period of time, and offers valuable information to your health care provider. The images from an echocardiogram can provide information such as:  Evidence of coronary artery disease (CAD).  Heart size.  Heart muscle function.  Heart valve function.  Aneurysm detection.  Evidence of a past heart attack.  Fluid buildup around the heart.  Heart muscle thickening.  Assess heart valve function.  Tell a health care provider about:  Any allergies you have.  All medicines you are taking, including vitamins, herbs, eye drops, creams, and  over-the-counter medicines.  Any problems you or family members have had with anesthetic medicines.  Any blood disorders you have.  Any surgeries you have had.  Any medical conditions you have.  Whether you are pregnant or may be pregnant. What happens before the procedure? No special preparation is needed. Eat and drink normally. What happens during the procedure?  In order to produce an image of your heart, gel will be applied to your chest and a wand-like tool (transducer) will be moved over your chest. The gel will help transmit the sound waves from the transducer. The sound waves will harmlessly bounce off your heart to allow the heart images to be captured in real-time motion. These images will then be recorded.  You may need an IV to receive a medicine that improves the quality of the pictures. What happens after the procedure? You may return to your normal schedule including diet, activities, and medicines, unless your health care provider tells you otherwise. This information is not intended to replace advice given to you by your health care provider. Make sure you discuss any questions you have with your health care provider. Document Released: 06/29/2000 Document Revised: 02/18/2016 Document Reviewed: 03/09/2013 Elsevier Interactive Patient Education  2017 [employer].

## 2017-11-01 LAB — BASIC METABOLIC PANEL
BUN / CREAT RATIO: 12 (ref 9–20)
BUN: 14 mg/dL (ref 6–24)
CHLORIDE: 102 mmol/L (ref 96–106)
CO2: 24 mmol/L (ref 20–29)
Calcium: 10.4 mg/dL — ABNORMAL HIGH (ref 8.7–10.2)
Creatinine, Ser: 1.18 mg/dL (ref 0.76–1.27)
GFR calc Af Amer: 84 mL/min/{1.73_m2} (ref >59)
GFR calc non Af Amer: 73 mL/min/{1.73_m2} (ref >59)
GLUCOSE: 97 mg/dL (ref 65–99)
Potassium: 4.2 mmol/L (ref 3.5–5.2)
SODIUM: 141 mmol/L (ref 134–144)

## 2017-11-01 LAB — CBC WITH DIFFERENTIAL/PLATELET
BASOS ABS: 0 10*3/uL (ref 0.0–0.2)
Basos: 0 %
EOS (ABSOLUTE): 0.2 10*3/uL (ref 0.0–0.4)
Eos: 4 %
Hematocrit: 42.5 % (ref 37.5–51.0)
Hemoglobin: 13.6 g/dL (ref 13.0–17.7)
IMMATURE GRANULOCYTES: 0 %
Immature Grans (Abs): 0 10*3/uL (ref 0.0–0.1)
LYMPHS ABS: 3 10*3/uL (ref 0.7–3.1)
Lymphs: 51 %
MCH: 22.2 pg — ABNORMAL LOW (ref 26.6–33.0)
MCHC: 32 g/dL (ref 31.5–35.7)
MCV: 69 fL — ABNORMAL LOW (ref 79–97)
MONOCYTES: 6 %
MONOS ABS: 0.3 10*3/uL (ref 0.1–0.9)
NEUTROS PCT: 39 %
Neutrophils Absolute: 2.3 10*3/uL (ref 1.4–7.0)
PLATELETS: 275 10*3/uL (ref 150–379)
RBC: 6.12 x10E6/uL — ABNORMAL HIGH (ref 4.14–5.80)
RDW: 16.5 % — AB (ref 12.3–15.4)
WBC: 5.8 10*3/uL (ref 3.4–10.8)

## 2017-11-01 LAB — LIPID PANEL
CHOLESTEROL TOTAL: 196 mg/dL (ref 100–199)
Chol/HDL Ratio: 5.4 ratio — ABNORMAL HIGH (ref 0.0–5.0)
HDL: 36 mg/dL — AB (ref >39)
LDL CALC: 128 mg/dL — AB (ref 0–99)
TRIGLYCERIDES: 161 mg/dL — AB (ref 0–149)
VLDL CHOLESTEROL CAL: 32 mg/dL (ref 5–40)

## 2017-11-01 LAB — HEPATIC FUNCTION PANEL
ALT: 27 IU/L (ref 0–44)
AST: 29 IU/L (ref 0–40)
Albumin: 4.6 g/dL (ref 3.5–5.5)
Alkaline Phosphatase: 91 IU/L (ref 39–117)
Bilirubin Total: 0.4 mg/dL (ref 0.0–1.2)
Bilirubin, Direct: 0.09 mg/dL (ref 0.00–0.40)
Total Protein: 7.7 g/dL (ref 6.0–8.5)

## 2017-11-01 LAB — TSH: TSH: 1.19 u[IU]/mL (ref 0.450–4.500)

## 2017-11-04 ENCOUNTER — Telehealth: Payer: Self-pay

## 2017-11-04 NOTE — Telephone Encounter (Signed)
-----   Message from Jenean Lindau, MD sent at 11/01/2017  8:10 AM EDT ----- The results of the study is unremarkable.  Lipids are elevated and he needs to diet better.  Recheck in 4 months.  Please inform patient. I will discuss in detail at next appointment. Cc  primary care/referring physician Jenean Lindau, MD 11/01/2017 8:10 AM

## 2017-11-04 NOTE — Telephone Encounter (Signed)
Left voicemail for the patient to call the office to discuss labs.

## 2017-11-05 ENCOUNTER — Telehealth: Payer: Self-pay

## 2017-11-05 ENCOUNTER — Telehealth: Payer: Self-pay | Admitting: Cardiology

## 2017-11-05 DIAGNOSIS — E785 Hyperlipidemia, unspecified: Secondary | ICD-10-CM

## 2017-11-05 NOTE — Telephone Encounter (Signed)
Return call about labs yesterday-prefers a call back between 8 and 12

## 2017-11-05 NOTE — Telephone Encounter (Signed)
Left message to return call 

## 2017-11-05 NOTE — Telephone Encounter (Signed)
-----   Message from Jenean Lindau, MD sent at 11/01/2017  8:10 AM EDT ----- The results of the study is unremarkable.  Lipids are elevated and he needs to diet better.  Recheck in 4 months.  Please inform patient. I will discuss in detail at next appointment. Cc  primary care/referring physician Jenean Lindau, MD 11/01/2017 8:10 AM

## 2017-11-05 NOTE — Telephone Encounter (Signed)
Patient notified of lab results per Dr Geraldo Pitter.  Patient advised of elevated lipid profile.  Patient advised to follow low cholesterol diet, and recheck lipids in 4 months.  Patient verbalized understanding.

## 2017-11-07 ENCOUNTER — Other Ambulatory Visit: Payer: Self-pay

## 2017-11-07 DIAGNOSIS — E782 Mixed hyperlipidemia: Secondary | ICD-10-CM

## 2017-11-11 ENCOUNTER — Telehealth (HOSPITAL_COMMUNITY): Payer: Self-pay | Admitting: *Deleted

## 2017-11-11 NOTE — Telephone Encounter (Signed)
Left message on voicemail per DPR in reference to upcoming appointment scheduled on 11/14/17 with detailed instructions given per Myocardial Perfusion Study Information Sheet for the test. LM to arrive 15 minutes early, and that it is imperative to arrive on time for appointment to keep from having the test rescheduled. If you need to cancel or reschedule your appointment, please call the office within 24 hours of your appointment. Failure to do so may result in a cancellation of your appointment, and a $50 no show fee. Phone number given for call back for any questions. Kirstie Peri

## 2017-11-14 ENCOUNTER — Other Ambulatory Visit: Payer: Self-pay

## 2017-11-14 ENCOUNTER — Ambulatory Visit (HOSPITAL_COMMUNITY): Payer: BLUE CROSS/BLUE SHIELD | Attending: Cardiology

## 2017-11-14 ENCOUNTER — Other Ambulatory Visit (HOSPITAL_COMMUNITY): Payer: BLUE CROSS/BLUE SHIELD

## 2017-11-14 ENCOUNTER — Ambulatory Visit (HOSPITAL_BASED_OUTPATIENT_CLINIC_OR_DEPARTMENT_OTHER): Payer: BLUE CROSS/BLUE SHIELD

## 2017-11-14 VITALS — Ht 70.0 in | Wt 270.0 lb

## 2017-11-14 DIAGNOSIS — Z87891 Personal history of nicotine dependence: Secondary | ICD-10-CM | POA: Diagnosis not present

## 2017-11-14 DIAGNOSIS — R7303 Prediabetes: Secondary | ICD-10-CM | POA: Insufficient documentation

## 2017-11-14 DIAGNOSIS — R079 Chest pain, unspecified: Secondary | ICD-10-CM | POA: Diagnosis not present

## 2017-11-14 DIAGNOSIS — E785 Hyperlipidemia, unspecified: Secondary | ICD-10-CM | POA: Insufficient documentation

## 2017-11-14 DIAGNOSIS — I517 Cardiomegaly: Secondary | ICD-10-CM | POA: Insufficient documentation

## 2017-11-14 DIAGNOSIS — G4733 Obstructive sleep apnea (adult) (pediatric): Secondary | ICD-10-CM | POA: Diagnosis not present

## 2017-11-14 DIAGNOSIS — R0602 Shortness of breath: Secondary | ICD-10-CM | POA: Diagnosis not present

## 2017-11-14 DIAGNOSIS — Z86711 Personal history of pulmonary embolism: Secondary | ICD-10-CM | POA: Diagnosis not present

## 2017-11-14 LAB — MYOCARDIAL PERFUSION IMAGING
CHL CUP NUCLEAR SDS: 1
CHL CUP NUCLEAR SRS: 3
CSEPEDS: 0 s
CSEPHR: 94 %
CSEPPHR: 162 {beats}/min
Estimated workload: 10.1 METS
Exercise duration (min): 8 min
LV dias vol: 160 mL (ref 62–150)
LVSYSVOL: 76 mL
MPHR: 172 {beats}/min
NUC STRESS TID: 0.97
RATE: 0.33
Rest HR: 67 {beats}/min
SSS: 4

## 2017-11-14 MED ORDER — TECHNETIUM TC 99M TETROFOSMIN IV KIT
10.2000 | PACK | Freq: Once | INTRAVENOUS | Status: AC | PRN
  Administered 2017-11-14: 10.2 via INTRAVENOUS
  Filled 2017-11-14: qty 11

## 2017-11-14 MED ORDER — PERFLUTREN LIPID MICROSPHERE
1.0000 mL | INTRAVENOUS | Status: AC | PRN
  Administered 2017-11-14: 1 mL via INTRAVENOUS

## 2017-11-14 MED ORDER — TECHNETIUM TC 99M TETROFOSMIN IV KIT
32.9000 | PACK | Freq: Once | INTRAVENOUS | Status: AC | PRN
  Administered 2017-11-14: 32.9 via INTRAVENOUS
  Filled 2017-11-14: qty 33

## 2018-02-19 ENCOUNTER — Other Ambulatory Visit: Payer: Self-pay | Admitting: *Deleted

## 2018-02-19 DIAGNOSIS — E782 Mixed hyperlipidemia: Secondary | ICD-10-CM

## 2018-02-20 LAB — LIPID PANEL
CHOL/HDL RATIO: 6.1 ratio — AB (ref 0.0–5.0)
Cholesterol, Total: 212 mg/dL — ABNORMAL HIGH (ref 100–199)
HDL: 35 mg/dL — ABNORMAL LOW (ref >39)
LDL CALC: 132 mg/dL — AB (ref 0–99)
TRIGLYCERIDES: 227 mg/dL — AB (ref 0–149)
VLDL CHOLESTEROL CAL: 45 mg/dL — AB (ref 5–40)

## 2018-02-20 LAB — HEPATIC FUNCTION PANEL
ALK PHOS: 88 IU/L (ref 39–117)
ALT: 22 IU/L (ref 0–44)
AST: 21 IU/L (ref 0–40)
Albumin: 4.3 g/dL (ref 3.5–5.5)
BILIRUBIN TOTAL: 0.4 mg/dL (ref 0.0–1.2)
BILIRUBIN, DIRECT: 0.12 mg/dL (ref 0.00–0.40)
Total Protein: 7.2 g/dL (ref 6.0–8.5)

## 2018-02-21 ENCOUNTER — Telehealth: Payer: Self-pay | Admitting: Emergency Medicine

## 2018-02-21 ENCOUNTER — Other Ambulatory Visit: Payer: Self-pay

## 2018-02-21 DIAGNOSIS — E782 Mixed hyperlipidemia: Secondary | ICD-10-CM

## 2018-02-21 MED ORDER — ATORVASTATIN CALCIUM 40 MG PO TABS: 40.0000 mg | ORAL_TABLET | Freq: Every day | ORAL | 3 refills | Status: AC

## 2018-02-21 NOTE — Telephone Encounter (Signed)
Left message for patient to return call regarding lab results.  

## 2018-05-11 ENCOUNTER — Encounter (HOSPITAL_COMMUNITY): Payer: Self-pay | Admitting: Emergency Medicine

## 2018-05-11 ENCOUNTER — Ambulatory Visit (HOSPITAL_COMMUNITY)
Admission: EM | Admit: 2018-05-11 | Discharge: 2018-05-11 | Disposition: A | Discharge status: Home / Self Care | Payer: BLUE CROSS/BLUE SHIELD | Attending: Family Medicine | Admitting: Family Medicine

## 2018-05-11 DIAGNOSIS — R030 Elevated blood-pressure reading, without diagnosis of hypertension: Secondary | ICD-10-CM

## 2018-05-11 DIAGNOSIS — H9202 Otalgia, left ear: Secondary | ICD-10-CM

## 2018-05-11 DIAGNOSIS — H66002 Acute suppurative otitis media without spontaneous rupture of ear drum, left ear: Secondary | ICD-10-CM

## 2018-05-11 MED ORDER — ACETAMINOPHEN 325 MG PO TABS: 650.0000 mg | ORAL_TABLET | Freq: Four times a day (QID) | ORAL | 0 refills | Status: AC | PRN

## 2018-05-11 MED ORDER — AMOXICILLIN-POT CLAVULANATE 875-125 MG PO TABS: 1.0000 | ORAL_TABLET | Freq: Two times a day (BID) | ORAL | 0 refills | Status: AC

## 2018-05-11 NOTE — Discharge Instructions (Signed)
°  Rest and drink plenty of fluids Prescribed augmentin 875 for 10 days Take medications as directed and to completion Tylenol 325mg  prescribed.  Take as needed for pain Follow up with PCP if symptoms persists Return here or go to the ER if you have any new or worsening symptoms fever, worsening ear pain, redness, swelling, nausea, vomiting, etc..Marland Kitchen

## 2018-05-11 NOTE — ED Provider Notes (Signed)
[facility]   [id number] 05/11/18 Arrival Time: [place]: History from: patient.  [name] [name] is a 47 y.o. male who presents with left ear pain for the past 3 days.  Denies a precipitating event, such as trauma, swimming or wearing ear plugs.  Patient states the pain is constant and achy in character.  Patient has tried OTC medications and peroxide without relief.  Symptoms are made worse with lying down.  Reports similar symptoms as a child and diagnosed with an ear infection.  Reports associated dizziness.  Denies fever, chills, fatigue, sinus pain, otorrhea, tinnitus, rhinorrhea, sore throat, SOB, wheezing, chest pain, nausea, changes in bowel or bladder habits.    ROS: As per HPI.  Past Medical History:  Diagnosis Date  . Hypercholesteremia   . Pulmonary emboli ([place])    History reviewed. No pertinent surgical history. No Known Allergies No current facility-administered medications on file prior to encounter.    Current Outpatient Medications on File Prior to Encounter  Medication Sig Dispense Refill  . atorvastatin (LIPITOR) 40 MG tablet Take 1 tablet (40 mg total) by mouth daily at 6 PM. 90 tablet 3  . Biotin 5000 MCG TABS Take 1 tablet by mouth 3 (three) times daily.    . Multiple Vitamin (MULITIVITAMIN WITH MINERALS) TABS Take 1 tablet by mouth daily.    . rivaroxaban (XARELTO) 20 MG TABS tablet Take 20 mg by mouth daily with supper.     Social History   Socioeconomic History  . Marital status: Single    Spouse name: Not on file  . Number of children: Not on file  . Years of education: Not on file  . Highest education level: Not on file  Occupational History  . Not on file  Social Needs  . Financial resource strain: Not on file  . Food insecurity:    Worry: Not on file    Inability: Not on file  . Transportation needs:    Medical: Not on file    Non-medical: Not on file  Tobacco Use  . Smoking status: Former Smoker   Packs/day: 0.50    Years: 20.00    Pack years: 10.00    Types: Cigarettes    Start date: 07/24/1987    Last attempt to quit: 08/24/2007    Years since quitting: 10.7  . Smokeless tobacco: Never Used  . Tobacco comment: quit smoking 6 years ago  Substance and Sexual Activity  . Alcohol use: Yes    Comment: occ  . Drug use: No  . Sexual activity: Not on file  Lifestyle  . Physical activity:    Days per week: Not on file    Minutes per session: Not on file  . Stress: Not on file  Relationships  . Social connections:    Talks on phone: Not on file    Gets together: Not on file    Attends religious service: Not on file    Active member of club or organization: Not on file    Attends meetings of clubs or organizations: Not on file    Relationship status: Not on file  . Intimate partner violence:    Fear of current or ex partner: Not on file    Emotionally abused: Not on file    Physically abused: Not on file    Forced sexual activity: Not on file  Other Topics Concern  . Not on file  Social History Narrative  . Not on file  Family History  Problem Relation Age of Onset  . Cancer Mother   . Hypertension Father   . Pulmonary embolism Sister   . Pulmonary embolism Other     OBJECTIVE:  Vitals:   05/11/18 1604  BP: (!) 158/99  Pulse: (!) 109  Resp: 18  Temp: 98.8 F (37.1 C)  TempSrc: Oral  SpO2: 98%    General appearance: alert; appears fatigued, but nontoxic HEENT: NCAT; Ears: RT EAC clear, RT TM pearly gray, LT EAC erythematous and swelling, TM with erythema and purulent fluid behind the TM, mild discomfort with tragal and auricle manipulation, no mastoid tenderness or erythema; Eyes: PERRL.  EOM grossly intact.  Nose: no rhinorrhea; tonsils nonerythematous, uvula midline  Neck: supple without LAD Lungs: CTAB Heart: regular rate and rhythm.  Radial pulses 2+ symmetrical bilaterally Skin: warm and dry Psychological: alert and cooperative; normal mood and  affect  ASSESSMENT & PLAN:  1. Non-recurrent acute suppurative otitis media of left ear without spontaneous rupture of tympanic membrane   2. Left ear pain   3. Elevated blood pressure reading     Meds ordered this encounter  Medications  . amoxicillin-clavulanate (AUGMENTIN) 875-125 MG tablet    Sig: Take 1 tablet by mouth every 12 (twelve) hours for 10 days.    Dispense:  20 tablet    Refill:  0    Order Specific Question:   Supervising Provider    Answer:   Wynona Luna [id number]  . acetaminophen (TYLENOL) 325 MG tablet    Sig: Take 2 tablets (650 mg total) by mouth every 6 (six) hours as needed for mild pain, moderate pain, fever or headache (or Fever >/= 101).    Dispense:  20 tablet    Refill:  0    Order Specific Question:   Supervising Provider    Answer:   Wynona Luna [[id number]]   Rest and drink plenty of fluids Prescribed augmentin 875 for 10 days Take medications as directed and to completion Tylenol 325mg  prescribed.  Take as needed for pain Follow up with PCP if symptoms persists Return here or go to the ER if you have any new or worsening symptoms fever, worsening ear pain, redness, swelling, nausea, vomiting, etc...  Reviewed expectations re: course of current medical issues. Questions answered. Outlined signs and symptoms indicating need for more acute intervention. Patient verbalized understanding. After Visit Summary given.         Lestine Box, PA-C 05/11/18 1710

## 2018-05-11 NOTE — ED Triage Notes (Signed)
Pt sts left ear pain x 3 days

## 2018-05-12 ENCOUNTER — Other Ambulatory Visit: Payer: Self-pay

## 2018-05-12 ENCOUNTER — Encounter (HOSPITAL_COMMUNITY): Payer: Self-pay

## 2018-05-12 ENCOUNTER — Ambulatory Visit (HOSPITAL_COMMUNITY)
Admission: EM | Admit: 2018-05-12 | Discharge: 2018-05-12 | Disposition: A | Discharge status: Home / Self Care | Payer: BLUE CROSS/BLUE SHIELD | Attending: Family Medicine | Admitting: Family Medicine

## 2018-05-12 DIAGNOSIS — H60502 Unspecified acute noninfective otitis externa, left ear: Secondary | ICD-10-CM | POA: Diagnosis not present

## 2018-05-12 MED ORDER — TRAMADOL HCL 50 MG PO TABS: 50.0000 mg | ORAL_TABLET | Freq: Four times a day (QID) | ORAL | 0 refills | Status: DC | PRN

## 2018-05-12 MED ORDER — KETOROLAC TROMETHAMINE 30 MG/ML IJ SOLN
INTRAMUSCULAR | Status: AC
  Filled 2018-05-12: qty 1

## 2018-05-12 MED ORDER — KETOROLAC TROMETHAMINE 30 MG/ML IJ SOLN
30.0000 mg | Freq: Once | INTRAMUSCULAR | Status: AC
  Administered 2018-05-12: 30 mg via INTRAMUSCULAR

## 2018-05-12 MED ORDER — NEOMYCIN-POLYMYXIN-HC 3.5-10000-1 OT SUSP: 4.0000 [drp] | Freq: Four times a day (QID) | OTIC | 0 refills | Status: AC

## 2018-05-12 NOTE — Discharge Instructions (Signed)
Continue with tylenol as needed for pain, may use tramadol as needed for breakthrough pain. May cause drowsiness. Please do not take if driving or drinking alcohol.   Use of ear drops, due to swelling be diligent to ensure adequate administration of ear drops.  Continue with the oral antibiotics as prescribed.  If continued to worsen or no improvement with treatment please follow up with your primary care provider or ENT

## 2018-05-12 NOTE — ED Provider Notes (Signed)
[facility]    CSN: [id number] Arrival date & time: 05/12/18  1459     History   Chief Complaint Chief Complaint  Patient presents with  . Otalgia    HPI [name] [name] is a 47 y.o. male.   [name] [name] presents with complaints of worsening of left ear pain. Started two days ago. Was seen her yesterday and started on augmentin, has taken two doses. Feels the pain has worsened and feels pain around the ear as well as with chewing. Pain 6/10. No cough or congestion. States he has noticed some crusting to external ear as there may be some drainage. No known fevers. No cough or sore throat. No gi/gu complaints. Denies any known triggers or trauma. Has been taking tylenol which has not helped. Hx of pe and is on blood thinner.     ROS per HPI.      Past Medical History:  Diagnosis Date  . Hypercholesteremia   . Pulmonary emboli [facility])     Patient Active Problem List   Diagnosis Date Noted  . History of pulmonary embolism 10/31/2017  . Recurrent pulmonary embolism ([place]) 04/07/2015  . Hyperlipidemia 04/07/2015  . OSA on CPAP 04/07/2015  . PE (pulmonary embolism) 04/07/2015  . Community acquired bacterial pneumonia 12/24/2011  . Pleurisy 12/24/2011  . Prediabetes 12/24/2011  . Chest pain 12/19/2011  . Pulmonary emboli bilateral 12/19/2011  . SOB (shortness of breath) 12/19/2011    History reviewed. No pertinent surgical history.     Home Medications    Prior to Admission medications   Medication Sig Start Date End Date Taking? Authorizing Provider  acetaminophen (TYLENOL) 325 MG tablet Take 2 tablets (650 mg total) by mouth every 6 (six) hours as needed for mild pain, moderate pain, fever or headache (or Fever >/= 101). 05/11/18   Wurst, [place], PA-C  amoxicillin-clavulanate (AUGMENTIN) 875-125 MG tablet Take 1 tablet by mouth every 12 (twelve) hours for 10 days. Patient not taking: Reported on 05/12/2018 05/11/18 05/21/18  Wurst, [place], PA-C    atorvastatin (LIPITOR) 40 MG tablet Take 1 tablet (40 mg total) by mouth daily at 6 PM. 02/21/18 05/22/18  Revankar, Reita Cliche, MD  Biotin 5000 MCG TABS Take 1 tablet by mouth 3 (three) times daily.    [provider]  Multiple Vitamin (MULITIVITAMIN WITH MINERALS) TABS Take 1 tablet by mouth daily.    [provider]  neomycin-polymyxin-hydrocortisone (CORTISPORIN) 3.5-10000-1 OTIC suspension Place 4 drops into the left ear 4 (four) times daily for 7 days. 05/12/18 05/19/18  Zigmund Gottron, NP  rivaroxaban (XARELTO) 20 MG TABS tablet Take 20 mg by mouth daily with supper.    [provider]  traMADol (ULTRAM) 50 MG tablet Take 1 tablet (50 mg total) by mouth every 6 (six) hours as needed for severe pain. 05/12/18   Zigmund Gottron, NP    Family History Family History  Problem Relation Age of Onset  . Cancer Mother   . Hypertension Father   . Pulmonary embolism Sister   . Pulmonary embolism Other     Social History Social History   Tobacco Use  . Smoking status: Former Smoker    Packs/day: 0.50    Years: 20.00    Pack years: 10.00    Types: Cigarettes    Start date: 07/24/1987    Last attempt to quit: 08/24/2007    Years since quitting: 10.7  . Smokeless tobacco: Never Used  . Tobacco comment: quit smoking 6 years ago  Substance Use Topics  . Alcohol use: Yes    Comment: occ  . Drug use: No     Allergies   Patient has no known allergies.   Review of Systems Review of Systems   Physical Exam Triage Vital Signs ED Triage Vitals  Enc Vitals Group     BP 05/12/18 1528 (!) 154/105     Pulse Rate 05/12/18 1528 94     Resp 05/12/18 1528 19     Temp 05/12/18 1528 98.3 F (36.8 C)     Temp Source 05/12/18 1528 Oral     SpO2 05/12/18 1528 97 %     Weight 05/12/18 1529 267 lb (121.1 kg)     Height --      Head Circumference --      Peak Flow --      Pain Score 05/12/18 1529 6     Pain Loc --      Pain Edu? --      Excl. in [place]? --    No data  found.  Updated Vital Signs BP (!) 154/105   Pulse 94   Temp 98.3 F (36.8 C) (Oral)   Resp 19   Wt 267 lb (121.1 kg)   SpO2 97%   BMI 38.31 kg/m    Physical Exam  Constitutional: He is oriented to person, place, and time. He appears well-developed and well-nourished.  HENT:  Head: Normocephalic and atraumatic.  Right Ear: Tympanic membrane, external ear and ear canal normal.  Left Ear: There is drainage, swelling and tenderness. No mastoid tenderness. Tympanic membrane is erythematous. Tympanic membrane is not bulging.  Nose: Nose normal. Right sinus exhibits no maxillary sinus tenderness and no frontal sinus tenderness. Left sinus exhibits no maxillary sinus tenderness and no frontal sinus tenderness.  Mouth/Throat: Uvula is midline, oropharynx is clear and moist and mucous membranes are normal.  Very swollen canal, very tender with exam, scant thin white discharge noted; TM remains red and bulging, partially occluded due to swelling of canal   Eyes: Pupils are equal, round, and reactive to light. Conjunctivae are normal.  Neck: Normal range of motion.  Cardiovascular: Normal rate and regular rhythm.  Pulmonary/Chest: Effort normal and breath sounds normal.  Lymphadenopathy:    He has no cervical adenopathy.  Neurological: He is alert and oriented to person, place, and time.  Skin: Skin is warm and dry.  Vitals reviewed.    UC Treatments / Results  Labs (all labs ordered are listed, but only abnormal results are displayed) Labs Reviewed - No data to display  EKG None  Radiology No results found.  Procedures Procedures (including critical care time)  Medications Ordered in UC Medications  ketorolac (TORADOL) 30 MG/ML injection 30 mg (30 mg Intramuscular Given 05/12/18 1613)    Initial Impression / Assessment and Plan / UC Course  I have reviewed the triage vital signs and the nursing notes.  Pertinent labs & imaging results that were available during my care  of the patient were reviewed by me and considered in my medical decision making (see chart for details).     External ear infection now present as well. Cortisporin provided, to continue with augmentin. toradol x1 provided in clinic today. 5 tabs tramadol for breakthrough pain- as is on a blood thinner. Return precautions provided. Follow up with PCP and/or ENT as needed. Patient verbalized understanding and agreeable to plan.    Final Clinical Impressions(s) / UC Diagnoses   Final diagnoses:  Acute otitis  externa of left ear, unspecified type     Discharge Instructions     Continue with tylenol as needed for pain, may use tramadol as needed for breakthrough pain. May cause drowsiness. Please do not take if driving or drinking alcohol.   Use of ear drops, due to swelling be diligent to ensure adequate administration of ear drops.  Continue with the oral antibiotics as prescribed.  If continued to worsen or no improvement with treatment please follow up with your primary care provider or ENT   ED Prescriptions    Medication Sig Dispense Auth. Provider   neomycin-polymyxin-hydrocortisone (CORTISPORIN) 3.5-10000-1 OTIC suspension Place 4 drops into the left ear 4 (four) times daily for 7 days. 10 mL Augusto Gamble B, NP   traMADol (ULTRAM) 50 MG tablet Take 1 tablet (50 mg total) by mouth every 6 (six) hours as needed for severe pain. 5 tablet Zigmund Gottron, NP     Controlled Substance Prescriptions [place] Controlled Substance Registry consulted? Not Applicable   Zigmund Gottron, NP 05/12/18 [phone]

## 2018-05-12 NOTE — ED Triage Notes (Signed)
Pt c/o left ear  Discomfort x 3days with drainage.

## 2018-07-21 DIAGNOSIS — G4733 Obstructive sleep apnea (adult) (pediatric): Secondary | ICD-10-CM | POA: Diagnosis not present

## 2018-09-10 DIAGNOSIS — G4733 Obstructive sleep apnea (adult) (pediatric): Secondary | ICD-10-CM | POA: Diagnosis not present

## 2018-09-10 DIAGNOSIS — G4719 Other hypersomnia: Secondary | ICD-10-CM | POA: Diagnosis not present

## 2018-11-26 DIAGNOSIS — Z79899 Other long term (current) drug therapy: Secondary | ICD-10-CM | POA: Diagnosis not present

## 2018-11-26 DIAGNOSIS — G4733 Obstructive sleep apnea (adult) (pediatric): Secondary | ICD-10-CM | POA: Diagnosis not present

## 2018-11-26 DIAGNOSIS — E7800 Pure hypercholesterolemia, unspecified: Secondary | ICD-10-CM | POA: Diagnosis not present

## 2018-11-26 DIAGNOSIS — Z86711 Personal history of pulmonary embolism: Secondary | ICD-10-CM | POA: Diagnosis not present

## 2018-12-23 ENCOUNTER — Encounter: Payer: Self-pay | Admitting: Family Medicine

## 2018-12-23 DIAGNOSIS — Z79899 Other long term (current) drug therapy: Secondary | ICD-10-CM | POA: Diagnosis not present

## 2018-12-23 DIAGNOSIS — E7800 Pure hypercholesterolemia, unspecified: Secondary | ICD-10-CM | POA: Diagnosis not present

## 2018-12-23 DIAGNOSIS — Z125 Encounter for screening for malignant neoplasm of prostate: Secondary | ICD-10-CM | POA: Diagnosis not present

## 2018-12-23 DIAGNOSIS — Z113 Encounter for screening for infections with a predominantly sexual mode of transmission: Secondary | ICD-10-CM | POA: Diagnosis not present

## 2019-05-21 ENCOUNTER — Other Ambulatory Visit: Payer: Self-pay

## 2019-05-21 DIAGNOSIS — Z20822 Contact with and (suspected) exposure to covid-19: Secondary | ICD-10-CM

## 2019-05-22 LAB — NOVEL CORONAVIRUS, NAA: SARS-CoV-2, NAA: DETECTED — AB

## 2019-06-01 DIAGNOSIS — D6859 Other primary thrombophilia: Secondary | ICD-10-CM | POA: Diagnosis not present

## 2019-06-01 DIAGNOSIS — Z1211 Encounter for screening for malignant neoplasm of colon: Secondary | ICD-10-CM | POA: Diagnosis not present

## 2019-06-01 DIAGNOSIS — Z86711 Personal history of pulmonary embolism: Secondary | ICD-10-CM | POA: Diagnosis not present

## 2019-07-06 DIAGNOSIS — Z01818 Encounter for other preprocedural examination: Secondary | ICD-10-CM | POA: Diagnosis not present

## 2019-07-22 DIAGNOSIS — G4733 Obstructive sleep apnea (adult) (pediatric): Secondary | ICD-10-CM | POA: Diagnosis not present

## 2019-08-17 DIAGNOSIS — Z1159 Encounter for screening for other viral diseases: Secondary | ICD-10-CM | POA: Diagnosis not present

## 2019-08-20 DIAGNOSIS — D123 Benign neoplasm of transverse colon: Secondary | ICD-10-CM | POA: Diagnosis not present

## 2019-08-20 DIAGNOSIS — Z1211 Encounter for screening for malignant neoplasm of colon: Secondary | ICD-10-CM | POA: Diagnosis not present

## 2019-08-20 DIAGNOSIS — D120 Benign neoplasm of cecum: Secondary | ICD-10-CM | POA: Diagnosis not present

## 2019-10-19 DIAGNOSIS — E1169 Type 2 diabetes mellitus with other specified complication: Secondary | ICD-10-CM | POA: Diagnosis not present

## 2019-10-19 DIAGNOSIS — R634 Abnormal weight loss: Secondary | ICD-10-CM | POA: Diagnosis not present

## 2019-10-26 DIAGNOSIS — E1169 Type 2 diabetes mellitus with other specified complication: Secondary | ICD-10-CM | POA: Diagnosis not present

## 2019-11-10 DIAGNOSIS — E119 Type 2 diabetes mellitus without complications: Secondary | ICD-10-CM | POA: Diagnosis not present

## 2019-11-10 DIAGNOSIS — H5203 Hypermetropia, bilateral: Secondary | ICD-10-CM | POA: Diagnosis not present

## 2019-12-03 ENCOUNTER — Encounter: Payer: Self-pay | Admitting: Skilled Nursing Facility1

## 2019-12-03 ENCOUNTER — Encounter: Payer: BC Managed Care – PPO | Attending: Family Medicine | Admitting: Skilled Nursing Facility1

## 2019-12-03 ENCOUNTER — Other Ambulatory Visit: Payer: Self-pay

## 2019-12-03 DIAGNOSIS — E119 Type 2 diabetes mellitus without complications: Secondary | ICD-10-CM | POA: Insufficient documentation

## 2019-12-03 NOTE — Progress Notes (Signed)
Pt states he has a bowel movement once a week.  Pt states he checks his blood sugars 1 time a day was checking 2 times a day until his blood sugars got better: 7am after work and 3:30pm.: still checking fasting:  147, 137, 152, 160, 133. Wakes 2pm starts eating 4pm typically eaten out. Pt states he goes to sleep about 2.5 hours after work getting about 5 hours of sleep nightly.  Goals: Switch up when you are checking; before and 2 hours after meals  Be sure to switch the white piece on your lancing device  Continue to check  Your feet daily Where your C-PAP EVERY night Eat one meal before work and 2 meals at work with maybe 1 snack while at work Aim for a minimum 70 ounces per day with a minimum 90 on hot days  Diabetes Self-Management Education  Visit Type:  First/Initial  12/03/2019  Mr. [name] [name], identified by name and date of birth, is a 49 y.o. male with a diagnosis of Diabetes: Type 2.   ASSESSMENT  Height 5\' 9"  (1.753 m), weight 262 lb 1.6 oz (118.9 kg). Body mass index is 38.71 kg/m.   Diabetes Self-Management Education - 12/03/19 0743      Health Coping   How would you rate your overall health?  Fair      Psychosocial Assessment   Patient Belief/Attitude about Diabetes  Afraid    Self-care barriers  None    Self-management support  Family;Friends    Patient Concerns  Nutrition/Meal planning      Pre-Education Assessment   Patient understands the diabetes disease and treatment process.  Needs Instruction    Patient understands incorporating nutritional management into lifestyle.  Needs Instruction    Patient undertands incorporating physical activity into lifestyle.  Needs Instruction    Patient understands using medications safely.  Needs Instruction    Patient understands monitoring blood glucose, interpreting and using results  Needs Instruction    Patient understands prevention, detection, and treatment of acute complications.  Needs Instruction    Patient  understands prevention, detection, and treatment of chronic complications.  Needs Instruction    Patient understands how to develop strategies to address psychosocial issues.  Needs Instruction    Patient understands how to develop strategies to promote health/change behavior.  Needs Instruction      Complications   Last HgB A1C per patient/outside source  11.4 %    How often do you check your blood sugar?  1-2 times/day    Fasting Blood glucose range (mg/dL)  130-179    Number of hypoglycemic episodes per month  0    Number of hyperglycemic episodes per week  0    Have you had a dilated eye exam in the past 12 months?  Yes    Have you had a dental exam in the past 12 months?  Yes    Are you checking your feet?  Yes    How many days per week are you checking your feet?  7      Dietary Intake   Breakfast  fast food    Snack (morning)  chips    Lunch  fruit cocktail or applausauce    Dinner  cereal    Beverage(s)  water, sweet tea, soda, beer, spirit      Exercise   Exercise Type  Light (walking / raking leaves)    How many days per week to you exercise?  5    How many  minutes per day do you exercise?  60    Total minutes per week of exercise  300      Patient Education   Previous Diabetes Education  No    Disease state   Definition of diabetes, type 1 and 2, and the diagnosis of diabetes;Factors that contribute to the development of diabetes    Acute complications  Taught treatment of hypoglycemia - the 15 rule.;Discussed and identified patients' treatment of hyperglycemia.    Personal strategies to promote health  Lifestyle issues that need to be addressed for better diabetes care      Individualized Goals (developed by patient)   Nutrition  General guidelines for healthy choices and portions discussed;Follow meal plan discussed    Physical Activity  Exercise 5-7 days per week;60 minutes per day    Monitoring   test my blood glucose as discussed;test blood glucose pre and post  meals as discussed      Post-Education Assessment   Patient understands the diabetes disease and treatment process.  Demonstrates understanding / competency    Patient understands incorporating nutritional management into lifestyle.  Demonstrates understanding / competency    Patient undertands incorporating physical activity into lifestyle.  Demonstrates understanding / competency    Patient understands using medications safely.  Demonstrates understanding / competency    Patient understands monitoring blood glucose, interpreting and using results  Demonstrates understanding / competency    Patient understands prevention, detection, and treatment of acute complications.  Demonstrates understanding / competency    Patient understands prevention, detection, and treatment of chronic complications.  Demonstrates understanding / competency    Patient understands how to develop strategies to address psychosocial issues.  Demonstrates understanding / competency    Patient understands how to develop strategies to promote health/change behavior.  Demonstrates understanding / competency      Outcomes   Program Status  Completed       Learning Objective:  Patient will have a greater understanding of diabetes self-management. Patient education plan is to attend individual and/or group sessions per assessed needs and concerns.   Expected Outcomes:  Demonstrated interest in learning. Expect positive outcomes  Education material provided: ADA - How to Thrive: A Guide for Your Journey with Diabetes, My Plate and Support group flyer  If problems or questions, patient to contact team via:  Phone  Future DSME appointment: - PRN

## 2020-01-04 DIAGNOSIS — E1169 Type 2 diabetes mellitus with other specified complication: Secondary | ICD-10-CM | POA: Diagnosis not present

## 2020-01-04 DIAGNOSIS — I2699 Other pulmonary embolism without acute cor pulmonale: Secondary | ICD-10-CM | POA: Diagnosis not present

## 2020-01-04 DIAGNOSIS — D6859 Other primary thrombophilia: Secondary | ICD-10-CM | POA: Diagnosis not present

## 2020-01-04 DIAGNOSIS — Z0000 Encounter for general adult medical examination without abnormal findings: Secondary | ICD-10-CM | POA: Diagnosis not present

## 2020-01-04 DIAGNOSIS — Z23 Encounter for immunization: Secondary | ICD-10-CM | POA: Diagnosis not present

## 2020-01-04 DIAGNOSIS — Z125 Encounter for screening for malignant neoplasm of prostate: Secondary | ICD-10-CM | POA: Diagnosis not present

## 2020-01-04 DIAGNOSIS — E7800 Pure hypercholesterolemia, unspecified: Secondary | ICD-10-CM | POA: Diagnosis not present

## 2020-01-10 ENCOUNTER — Ambulatory Visit (HOSPITAL_COMMUNITY)
Admission: EM | Admit: 2020-01-10 | Discharge: 2020-01-10 | Disposition: A | Discharge status: Home / Self Care | Payer: BC Managed Care – PPO

## 2020-01-10 ENCOUNTER — Encounter (HOSPITAL_COMMUNITY): Payer: Self-pay

## 2020-01-10 ENCOUNTER — Other Ambulatory Visit: Payer: Self-pay

## 2020-01-10 DIAGNOSIS — L237 Allergic contact dermatitis due to plants, except food: Secondary | ICD-10-CM | POA: Diagnosis not present

## 2020-01-10 MED ORDER — TRIAMCINOLONE ACETONIDE 0.5 % EX OINT: 1.0000 "application " | TOPICAL_OINTMENT | Freq: Two times a day (BID) | CUTANEOUS | 0 refills | Status: AC

## 2020-01-10 MED ORDER — METHYLPREDNISOLONE SODIUM SUCC 125 MG IJ SOLR
125.0000 mg | Freq: Once | INTRAMUSCULAR | Status: AC
  Administered 2020-01-10: 125 mg via INTRAMUSCULAR

## 2020-01-10 MED ORDER — HYDROXYZINE HCL 25 MG PO TABS: 25.0000 mg | ORAL_TABLET | Freq: Four times a day (QID) | ORAL | 0 refills | Status: AC

## 2020-01-10 MED ORDER — METHYLPREDNISOLONE SODIUM SUCC 125 MG IJ SOLR
INTRAMUSCULAR | Status: AC
  Filled 2020-01-10: qty 2

## 2020-01-10 MED ORDER — PREDNISONE 10 MG PO TABS: 10.0000 mg | ORAL_TABLET | Freq: Every day | ORAL | 0 refills | Status: AC

## 2020-01-10 NOTE — ED Provider Notes (Signed)
[place]    CSN: [id number] Arrival date & time: 01/10/20  1311      History   Chief Complaint Chief Complaint  Patient presents with  . Rash    HPI [name] [name] is a 49 y.o. male with history diabetes, PE presenting for poison ivy dermatitis.  States he has had left eye swelling, bilateral arm rash that is pruritic.  No swelling of lips, tongue, throat.  No difficulty breathing or swallowing, chest pain, palpitations.  Has been using calamine without relief.  States this began Friday evening: Did yard work earlier that day.  Has had this before: Feels similar.   Past Medical History:  Diagnosis Date  . Diabetes mellitus without complication ([place])   . Hypercholesteremia   . Pulmonary emboli [facility])     Patient Active Problem List   Diagnosis Date Noted  . History of pulmonary embolism 10/31/2017  . Recurrent pulmonary embolism ([place]) 04/07/2015  . Hyperlipidemia 04/07/2015  . OSA on CPAP 04/07/2015  . PE (pulmonary embolism) 04/07/2015  . Community acquired bacterial pneumonia 12/24/2011  . Pleurisy 12/24/2011  . Prediabetes 12/24/2011  . Chest pain 12/19/2011  . Pulmonary emboli bilateral 12/19/2011  . SOB (shortness of breath) 12/19/2011    History reviewed. No pertinent surgical history.     Home Medications    Prior to Admission medications   Medication Sig Start Date End Date Taking? Authorizing Provider  glimepiride (AMARYL) 1 MG tablet Take 1 mg by mouth every morning. 12/24/19  Yes [provider]  metFORMIN (GLUCOPHAGE-XR) 500 MG 24 hr tablet Take 500 mg by mouth 2 (two) times daily. 11/22/19  Yes [provider]  Multiple Vitamin (MULITIVITAMIN WITH MINERALS) TABS Take 1 tablet by mouth daily.   Yes [provider]  rivaroxaban (XARELTO) 20 MG TABS tablet Take 20 mg by mouth daily with supper.   Yes [provider]  acetaminophen (TYLENOL) 325 MG tablet Take 2 tablets (650 mg total) by mouth every 6 (six)  hours as needed for mild pain, moderate pain, fever or headache (or Fever >/= 101). 05/11/18   Wurst, [place], PA-C  atorvastatin (LIPITOR) 40 MG tablet Take 1 tablet (40 mg total) by mouth daily at 6 PM. 02/21/18 05/22/18  Revankar, Reita Cliche, MD  hydrOXYzine (ATARAX/VISTARIL) 25 MG tablet Take 1 tablet (25 mg total) by mouth every 6 (six) hours. 01/10/20   Hall-Potvin, [place], PA-C  predniSONE (DELTASONE) 10 MG tablet Take 1 tablet (10 mg total) by mouth daily with breakfast. 01/10/20   Hall-Potvin, [place], PA-C  triamcinolone ointment (KENALOG) 0.5 % Apply 1 application topically 2 (two) times daily. 01/10/20   Hall-Potvin, [place], PA-C    Family History Family History  Problem Relation Age of Onset  . Cancer Mother   . Hypertension Father   . Pulmonary embolism Sister   . Pulmonary embolism Other     Social History Social History   Tobacco Use  . Smoking status: Former Smoker    Packs/day: 0.50    Years: 20.00    Pack years: 10.00    Types: Cigarettes    Start date: 07/24/1987    Quit date: 08/24/2007    Years since quitting: 12.3  . Smokeless tobacco: Never Used  . Tobacco comment: quit smoking 6 years ago  Substance Use Topics  . Alcohol use: Yes    Comment: occ  . Drug use: No     Allergies   Patient has no known allergies.   Review of Systems  As per HPI   Physical Exam Triage Vital Signs ED Triage Vitals  Enc Vitals Group     BP      Pulse      Resp      Temp      Temp src      SpO2      Weight      Height      Head Circumference      Peak Flow      Pain Score      Pain Loc      Pain Edu?      Excl. in [place]?    No data found.  Updated Vital Signs BP (!) 158/95 (BP Location: Right Arm)   Pulse 80   Temp 98.3 F (36.8 C)   Resp 18   SpO2 99%   Visual Acuity Right Eye Distance:   Left Eye Distance:   Bilateral Distance:    Right Eye Near:   Left Eye Near:    Bilateral Near:     Physical Exam Constitutional:      General: He is not in  acute distress. HENT:     Head: Normocephalic and atraumatic.     Mouth/Throat:     Mouth: Mucous membranes are moist.     Pharynx: Oropharynx is clear.     Comments: No angioedema or uvula swelling Eyes:     General: No scleral icterus.       Right eye: No discharge.        Left eye: No discharge.     Extraocular Movements: Extraocular movements intact.     Conjunctiva/sclera: Conjunctivae normal.     Pupils: Pupils are equal, round, and reactive to light.     Comments: Patient does have moderate to severe periorbital edema of left eye (upper>lower lid) is nontender and without lesions, discharge.  Vision grossly intact.  Cardiovascular:     Rate and Rhythm: Normal rate.  Pulmonary:     Effort: Pulmonary effort is normal. No respiratory distress.     Breath sounds: No wheezing.  Skin:    Coloration: Skin is not jaundiced or pale.     Findings: Rash present.     Comments: Presented with dermatitis noted to bilateral upper extremities, neck, low jaw.    Neurological:     Mental Status: He is alert and oriented to person, place, and time.      UC Treatments / Results  Labs (all labs ordered are listed, but only abnormal results are displayed) Labs Reviewed - No data to display  EKG   Radiology No results found.  Procedures Procedures (including critical care time)  Medications Ordered in UC Medications  methylPREDNISolone sodium succinate (SOLU-MEDROL) 125 mg/2 mL injection 125 mg (125 mg Intramuscular Given 01/10/20 1423)    Initial Impression / Assessment and Plan / UC Course  I have reviewed the triage vital signs and the nursing notes.  Pertinent labs & imaging results that were available during my care of the patient were reviewed by me and considered in my medical decision making (see chart for details).     Patient afebrile, nontoxic, without airway compromise.  Patient does have significant poison ivy dermatitis including periorbital swelling of left eye.   Will use ice compresses, given Solu-Medrol office which he tolerated well.  Will follow with steroid taper as outlined below, hydroxyzine.  Patient reports external hemoglobin A1c value of 8% done earlier this week.  Denies symptoms of poor control.  No secondary skin infection noted at this time, though discussed risk thereof.  Return precautions discussed, patient verbalized understanding and is agreeable to plan. Final Clinical Impressions(s) / UC Diagnoses   Final diagnoses:  Poison ivy dermatitis     Discharge Instructions     Keep skin clean - may use gentle soaps without perfumes/dyes. Avoid hot water (showers, baths) as this can further dry out and irritate skin. Pat skin dry as rubbing can irritate and tear skin. Apply a gentle moisturizer 1-2 times daily.  Take steroid with breakfast: 6-5-4-3-2-1    ED Prescriptions    Medication Sig Dispense Auth. Provider   hydrOXYzine (ATARAX/VISTARIL) 25 MG tablet Take 1 tablet (25 mg total) by mouth every 6 (six) hours. 12 tablet Hall-Potvin, [place], PA-C   predniSONE (DELTASONE) 10 MG tablet Take 1 tablet (10 mg total) by mouth daily with breakfast. 21 tablet Hall-Potvin, [place], PA-C   triamcinolone ointment (KENALOG) 0.5 % Apply 1 application topically 2 (two) times daily. 30 g Hall-Potvin, [place], PA-C     PDMP not reviewed this encounter.   Hall-Potvin, [place], [place] 01/10/20 1433

## 2020-01-10 NOTE — Discharge Instructions (Signed)
Keep skin clean - may use gentle soaps without perfumes/dyes. Avoid hot water (showers, baths) as this can further dry out and irritate skin. Pat skin dry as rubbing can irritate and tear skin. Apply a gentle moisturizer 1-2 times daily.  Take steroid with breakfast: 6-5-4-3-2-1

## 2020-01-10 NOTE — ED Triage Notes (Signed)
Pt presents to UC for posion IVY on left eye, and arms. Pt's left eye noted to be swollen shut. Upon assessment pt found to have rash on bilateral arms. Pt states rash is also on bilateral legs. Pt has been treating with calamine lotion with out relief. Pt denies treatment or relieving factors for eye.

## 2020-03-15 IMAGING — NM NM MISC PROCEDURE
5 series · 30 of 30 positions shown · non-contrast
Comparison: none

[Series 1: rest · 6.51mm/px · 6 of 64 frames shown]
[frame 6/64]
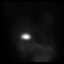
[frame 16/64]
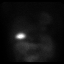
[frame 27/64]
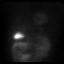
[frame 38/64]
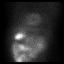
[frame 48/64]
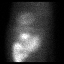
[frame 59/64]
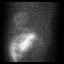

[Series 1: wbr_r-proj_st rest · 6.51mm/px · 6 of 64 frames shown]
[frame 6/64]
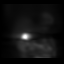
[frame 16/64]
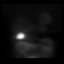
[frame 27/64]
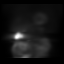
[frame 38/64]
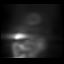
[frame 48/64]
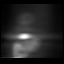
[frame 59/64]
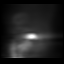

[Series 2: wbr_s-proj_st stress - gated · 6.51mm/px · 6 of 512 frames shown]
[frame 43/512]
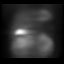
[frame 128/512]
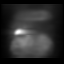
[frame 214/512]
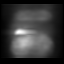
[frame 299/512]
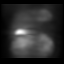
[frame 384/512]
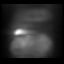
[frame 470/512]
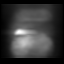

[Series 2: stress - gated · 6.51mm/px · 6 of 512 frames shown]
[frame 43/512]
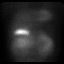
[frame 128/512]
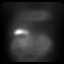
[frame 214/512]
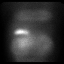
[frame 299/512]
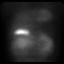
[frame 384/512]
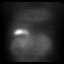
[frame 470/512]
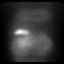

[Series 3: stress - perfusion · 6.51mm/px · 6 of 64 frames shown]
[frame 6/64]
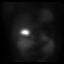
[frame 16/64]
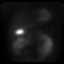
[frame 27/64]
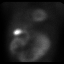
[frame 38/64]
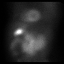
[frame 48/64]
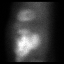
[frame 59/64]
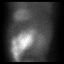

[30 of 30 positions shown; findings below may reference images not displayed]

Canned report from images found in remote index.

Refer to host system for actual result text.

## 2020-04-19 DIAGNOSIS — Z23 Encounter for immunization: Secondary | ICD-10-CM | POA: Diagnosis not present

## 2020-07-04 ENCOUNTER — Ambulatory Visit (HOSPITAL_COMMUNITY)
Admission: EM | Admit: 2020-07-04 | Discharge: 2020-07-04 | Disposition: A | Discharge status: Home / Self Care | Payer: BC Managed Care – PPO | Attending: Physician Assistant | Admitting: Physician Assistant

## 2020-07-04 ENCOUNTER — Encounter (HOSPITAL_COMMUNITY): Payer: Self-pay | Admitting: Emergency Medicine

## 2020-07-04 ENCOUNTER — Other Ambulatory Visit: Payer: Self-pay

## 2020-07-04 DIAGNOSIS — H1132 Conjunctival hemorrhage, left eye: Secondary | ICD-10-CM | POA: Diagnosis not present

## 2020-07-04 NOTE — ED Provider Notes (Addendum)
[place]    CSN: [id number] Arrival date & time: 07/04/20  1715      History   Chief Complaint Chief Complaint  Patient presents with  . Eye Problem    HPI [name] [name] is a 49 y.o. male.   Pt complains of left eye redness that started 4 days ago.  Denies injury or trauma.  He denies visual disturbances or pain.  He reports he may have noted some discharge this morning.  He does not wear contacts or glasses.  Reports he is planning to see an eye doctor tomorrow.       Past Medical History:  Diagnosis Date  . Diabetes mellitus without complication ([place])   . Hypercholesteremia   . Pulmonary emboli [facility])     Patient Active Problem List   Diagnosis Date Noted  . History of pulmonary embolism 10/31/2017  . Recurrent pulmonary embolism ([place]) 04/07/2015  . Hyperlipidemia 04/07/2015  . OSA on CPAP 04/07/2015  . PE (pulmonary embolism) 04/07/2015  . Community acquired bacterial pneumonia 12/24/2011  . Pleurisy 12/24/2011  . Prediabetes 12/24/2011  . Chest pain 12/19/2011  . Pulmonary emboli bilateral 12/19/2011  . SOB (shortness of breath) 12/19/2011    History reviewed. No pertinent surgical history.     Home Medications    Prior to Admission medications   Medication Sig Start Date End Date Taking? Authorizing Provider  glimepiride (AMARYL) 1 MG tablet Take 1 mg by mouth every morning. 12/24/19  Yes [provider]  metFORMIN (GLUCOPHAGE-XR) 500 MG 24 hr tablet Take 500 mg by mouth 2 (two) times daily. 11/22/19  Yes [provider]  rivaroxaban (XARELTO) 20 MG TABS tablet Take 20 mg by mouth daily with supper.   Yes [provider]  acetaminophen (TYLENOL) 325 MG tablet Take 2 tablets (650 mg total) by mouth every 6 (six) hours as needed for mild pain, moderate pain, fever or headache (or Fever >/= 101). 05/11/18   Wurst, [place], PA-C  atorvastatin (LIPITOR) 40 MG tablet Take 1 tablet (40 mg total) by mouth daily at 6 PM.  02/21/18 05/22/18  Revankar, Reita Cliche, MD  hydrOXYzine (ATARAX/VISTARIL) 25 MG tablet Take 1 tablet (25 mg total) by mouth every 6 (six) hours. 01/10/20   Hall-Potvin, [place], PA-C  Multiple Vitamin (MULITIVITAMIN WITH MINERALS) TABS Take 1 tablet by mouth daily.    [provider]  predniSONE (DELTASONE) 10 MG tablet Take 1 tablet (10 mg total) by mouth daily with breakfast. 01/10/20   Hall-Potvin, [place], PA-C  triamcinolone ointment (KENALOG) 0.5 % Apply 1 application topically 2 (two) times daily. 01/10/20   Hall-Potvin, [place], PA-C    Family History Family History  Problem Relation Age of Onset  . Cancer Mother   . Hypertension Father   . Pulmonary embolism Sister   . Pulmonary embolism Other     Social History Social History   Tobacco Use  . Smoking status: Former Smoker    Packs/day: 0.50    Years: 20.00    Pack years: 10.00    Types: Cigarettes    Start date: 07/24/1987    Quit date: 08/24/2007    Years since quitting: 12.8  . Smokeless tobacco: Never Used  . Tobacco comment: quit smoking 6 years ago  Substance Use Topics  . Alcohol use: Yes    Comment: occ  . Drug use: No     Allergies   Patient has no known allergies.   Review of Systems Review of Systems  Constitutional:  Negative for chills and fever.  HENT: Negative for ear pain and sore throat.   Eyes: Positive for discharge and redness. Negative for pain and visual disturbance.  Respiratory: Negative for cough and shortness of breath.   Cardiovascular: Negative for chest pain and palpitations.  Gastrointestinal: Negative for abdominal pain and vomiting.  Genitourinary: Negative for dysuria and hematuria.  Musculoskeletal: Negative for arthralgias and back pain.  Skin: Negative for color change and rash.  Neurological: Negative for seizures and syncope.  All other systems reviewed and are negative.    Physical Exam Triage Vital Signs ED Triage Vitals  Enc Vitals Group     BP 07/04/20 1736  (!) 167/102     Pulse Rate 07/04/20 1736 94     Resp 07/04/20 1736 (!) 22     Temp 07/04/20 1736 98.2 F (36.8 C)     Temp Source 07/04/20 1736 Oral     SpO2 07/04/20 1736 100 %     Weight --      Height --      Head Circumference --      Peak Flow --      Pain Score 07/04/20 1734 0     Pain Loc --      Pain Edu? --      Excl. in [place]? --    No data found.  Updated Vital Signs BP (!) 167/102 (BP Location: Left Arm)   Pulse 94   Temp 98.2 F (36.8 C) (Oral)   Resp (!) 22   SpO2 100%   Visual Acuity Right Eye Distance:   Left Eye Distance:   Bilateral Distance:    Right Eye Near:   Left Eye Near:    Bilateral Near:     Physical Exam Vitals and nursing note reviewed.  Constitutional:      Appearance: He is well-developed and well-nourished.  HENT:     Head: Normocephalic and atraumatic.  Eyes:     General:        Right eye: No foreign body, discharge or hordeolum.        Left eye: No foreign body, discharge or hordeolum.     Conjunctiva/sclera:     Left eye: Hemorrhage present.  Cardiovascular:     Rate and Rhythm: Normal rate and regular rhythm.     Heart sounds: No murmur heard.   Pulmonary:     Effort: Pulmonary effort is normal. No respiratory distress.     Breath sounds: Normal breath sounds.  Abdominal:     Palpations: Abdomen is soft.     Tenderness: There is no abdominal tenderness.  Musculoskeletal:        General: No edema.     Cervical back: Neck supple.  Skin:    General: Skin is warm and dry.  Neurological:     Mental Status: He is alert.  Psychiatric:        Mood and Affect: Mood and affect normal.      UC Treatments / Results  Labs (all labs ordered are listed, but only abnormal results are displayed) Labs Reviewed - No data to display  EKG   Radiology No results found.  Procedures Procedures (including critical care time)  Medications Ordered in UC Medications - No data to display  Initial Impression / Assessment and  Plan / UC Course  I have reviewed the triage vital signs and the nursing notes.  Pertinent labs & imaging results that were available during my care of the patient were  reviewed by me and considered in my medical decision making (see chart for details).     Subconjunctival hemorrhage, painless red eye without visual changes; will resolve with time. Return precautions given.  Final Clinical Impressions(s) / UC Diagnoses   Final diagnoses:  None   Discharge Instructions   None    ED Prescriptions    None     PDMP not reviewed this encounter.   Konrad Felix, PA-C 07/04/20 1817    Konrad Felix, PA-C 07/04/20 1848

## 2020-07-04 NOTE — ED Triage Notes (Signed)
Woke Friday with left eye redness.  Reports left eye is getting redder.  Patient does not wear contacts.  Left eye does not hurt.  No known injury.    Patient is on a blood thinner-for blood clots

## 2020-07-05 DIAGNOSIS — H1132 Conjunctival hemorrhage, left eye: Secondary | ICD-10-CM | POA: Diagnosis not present

## 2020-08-17 DIAGNOSIS — G4733 Obstructive sleep apnea (adult) (pediatric): Secondary | ICD-10-CM | POA: Diagnosis not present

## 2020-11-10 DIAGNOSIS — H524 Presbyopia: Secondary | ICD-10-CM | POA: Diagnosis not present

## 2020-11-10 DIAGNOSIS — E119 Type 2 diabetes mellitus without complications: Secondary | ICD-10-CM | POA: Diagnosis not present

## 2021-02-07 DIAGNOSIS — Z125 Encounter for screening for malignant neoplasm of prostate: Secondary | ICD-10-CM | POA: Diagnosis not present

## 2021-02-07 DIAGNOSIS — E1169 Type 2 diabetes mellitus with other specified complication: Secondary | ICD-10-CM | POA: Diagnosis not present

## 2021-02-07 DIAGNOSIS — Z0000 Encounter for general adult medical examination without abnormal findings: Secondary | ICD-10-CM | POA: Diagnosis not present

## 2021-02-07 DIAGNOSIS — E7800 Pure hypercholesterolemia, unspecified: Secondary | ICD-10-CM | POA: Diagnosis not present

## 2021-02-13 DIAGNOSIS — E7800 Pure hypercholesterolemia, unspecified: Secondary | ICD-10-CM | POA: Diagnosis not present

## 2021-02-13 DIAGNOSIS — D6859 Other primary thrombophilia: Secondary | ICD-10-CM | POA: Diagnosis not present

## 2021-02-13 DIAGNOSIS — E1169 Type 2 diabetes mellitus with other specified complication: Secondary | ICD-10-CM | POA: Diagnosis not present

## 2021-02-13 DIAGNOSIS — Z0000 Encounter for general adult medical examination without abnormal findings: Secondary | ICD-10-CM | POA: Diagnosis not present

## 2021-02-13 DIAGNOSIS — Z23 Encounter for immunization: Secondary | ICD-10-CM | POA: Diagnosis not present

## 2021-05-16 DIAGNOSIS — E1169 Type 2 diabetes mellitus with other specified complication: Secondary | ICD-10-CM | POA: Diagnosis not present

## 2021-05-16 DIAGNOSIS — Z23 Encounter for immunization: Secondary | ICD-10-CM | POA: Diagnosis not present

## 2021-05-16 DIAGNOSIS — R030 Elevated blood-pressure reading, without diagnosis of hypertension: Secondary | ICD-10-CM | POA: Diagnosis not present

## 2021-09-14 DIAGNOSIS — I10 Essential (primary) hypertension: Secondary | ICD-10-CM | POA: Diagnosis not present

## 2021-09-14 DIAGNOSIS — M25551 Pain in right hip: Secondary | ICD-10-CM | POA: Diagnosis not present

## 2021-09-14 DIAGNOSIS — E1169 Type 2 diabetes mellitus with other specified complication: Secondary | ICD-10-CM | POA: Diagnosis not present

## 2021-09-14 DIAGNOSIS — G4733 Obstructive sleep apnea (adult) (pediatric): Secondary | ICD-10-CM | POA: Diagnosis not present

## 2021-09-14 DIAGNOSIS — Z79899 Other long term (current) drug therapy: Secondary | ICD-10-CM | POA: Diagnosis not present

## 2021-09-29 DIAGNOSIS — I10 Essential (primary) hypertension: Secondary | ICD-10-CM | POA: Diagnosis not present

## 2021-09-29 DIAGNOSIS — E1169 Type 2 diabetes mellitus with other specified complication: Secondary | ICD-10-CM | POA: Diagnosis not present

## 2021-09-29 DIAGNOSIS — Z79899 Other long term (current) drug therapy: Secondary | ICD-10-CM | POA: Diagnosis not present

## 2022-02-21 DIAGNOSIS — R7309 Other abnormal glucose: Secondary | ICD-10-CM | POA: Diagnosis not present

## 2022-02-21 DIAGNOSIS — Z0000 Encounter for general adult medical examination without abnormal findings: Secondary | ICD-10-CM | POA: Diagnosis not present

## 2022-02-21 DIAGNOSIS — Z125 Encounter for screening for malignant neoplasm of prostate: Secondary | ICD-10-CM | POA: Diagnosis not present

## 2022-02-21 DIAGNOSIS — Z13220 Encounter for screening for lipoid disorders: Secondary | ICD-10-CM | POA: Diagnosis not present

## 2022-03-08 ENCOUNTER — Other Ambulatory Visit (HOSPITAL_BASED_OUTPATIENT_CLINIC_OR_DEPARTMENT_OTHER): Payer: Self-pay | Admitting: Family Medicine

## 2022-03-08 ENCOUNTER — Other Ambulatory Visit: Payer: Self-pay | Admitting: Family Medicine

## 2022-03-08 DIAGNOSIS — E7800 Pure hypercholesterolemia, unspecified: Secondary | ICD-10-CM | POA: Diagnosis not present

## 2022-03-08 DIAGNOSIS — G4733 Obstructive sleep apnea (adult) (pediatric): Secondary | ICD-10-CM | POA: Diagnosis not present

## 2022-03-08 DIAGNOSIS — I2699 Other pulmonary embolism without acute cor pulmonale: Secondary | ICD-10-CM | POA: Diagnosis not present

## 2022-03-08 DIAGNOSIS — Z0000 Encounter for general adult medical examination without abnormal findings: Secondary | ICD-10-CM | POA: Diagnosis not present

## 2022-03-08 DIAGNOSIS — I10 Essential (primary) hypertension: Secondary | ICD-10-CM | POA: Diagnosis not present

## 2022-03-08 DIAGNOSIS — E1169 Type 2 diabetes mellitus with other specified complication: Secondary | ICD-10-CM | POA: Diagnosis not present

## 2022-03-23 ENCOUNTER — Ambulatory Visit (HOSPITAL_COMMUNITY)
Admission: RE | Admit: 2022-03-23 | Discharge: 2022-03-23 | Disposition: A | Discharge status: Home / Self Care | Payer: BC Managed Care – PPO | Source: Ambulatory Visit | Attending: Family Medicine | Admitting: Family Medicine

## 2022-03-23 DIAGNOSIS — E7800 Pure hypercholesterolemia, unspecified: Secondary | ICD-10-CM | POA: Insufficient documentation

## 2022-04-09 DIAGNOSIS — G4733 Obstructive sleep apnea (adult) (pediatric): Secondary | ICD-10-CM | POA: Diagnosis not present

## 2022-04-13 DIAGNOSIS — M25551 Pain in right hip: Secondary | ICD-10-CM | POA: Diagnosis not present

## 2022-04-13 DIAGNOSIS — M62551 Muscle wasting and atrophy, not elsewhere classified, right thigh: Secondary | ICD-10-CM | POA: Diagnosis not present

## 2022-04-13 DIAGNOSIS — M5441 Lumbago with sciatica, right side: Secondary | ICD-10-CM | POA: Diagnosis not present

## 2022-04-15 ENCOUNTER — Encounter (HOSPITAL_COMMUNITY): Payer: Self-pay

## 2022-04-15 ENCOUNTER — Emergency Department (HOSPITAL_COMMUNITY): Payer: BC Managed Care – PPO

## 2022-04-15 ENCOUNTER — Emergency Department (HOSPITAL_COMMUNITY)
Admission: EM | Admit: 2022-04-15 | Discharge: 2022-04-15 | Disposition: A | Discharge status: Home / Self Care | Payer: BC Managed Care – PPO | Attending: Emergency Medicine | Admitting: Emergency Medicine

## 2022-04-15 DIAGNOSIS — M5441 Lumbago with sciatica, right side: Secondary | ICD-10-CM | POA: Insufficient documentation

## 2022-04-15 DIAGNOSIS — Z7984 Long term (current) use of oral hypoglycemic drugs: Secondary | ICD-10-CM | POA: Insufficient documentation

## 2022-04-15 DIAGNOSIS — E119 Type 2 diabetes mellitus without complications: Secondary | ICD-10-CM | POA: Insufficient documentation

## 2022-04-15 DIAGNOSIS — Z7901 Long term (current) use of anticoagulants: Secondary | ICD-10-CM | POA: Insufficient documentation

## 2022-04-15 DIAGNOSIS — M545 Low back pain, unspecified: Secondary | ICD-10-CM | POA: Diagnosis not present

## 2022-04-15 DIAGNOSIS — M25551 Pain in right hip: Secondary | ICD-10-CM | POA: Diagnosis not present

## 2022-04-15 MED ORDER — KETOROLAC TROMETHAMINE 15 MG/ML IJ SOLN
15.0000 mg | Freq: Once | INTRAMUSCULAR | Status: AC
  Administered 2022-04-15: 15 mg via INTRAMUSCULAR
  Filled 2022-04-15: qty 1

## 2022-04-15 MED ORDER — LIDOCAINE 5 % EX PTCH: 1.0000 | MEDICATED_PATCH | CUTANEOUS | 0 refills | Status: AC

## 2022-04-15 MED ORDER — METHOCARBAMOL 500 MG PO TABS: 500.0000 mg | ORAL_TABLET | Freq: Two times a day (BID) | ORAL | 0 refills | Status: AC

## 2022-04-15 MED ORDER — LIDOCAINE 5 % EX PTCH
1.0000 | MEDICATED_PATCH | CUTANEOUS | Status: DC
  Administered 2022-04-15: 1 via TRANSDERMAL
  Filled 2022-04-15: qty 1

## 2022-04-15 NOTE — ED Provider Triage Note (Addendum)
Emergency Medicine Provider Triage Evaluation Note  [name] [name] , [name] 51 y.o. male  was evaluated in triage.  Pt complains of right-sided back and hip pain.  Patient states that symptoms been present since Thursday.  He notes history of similar symptoms that have gone away independent of medical therapy over the past few years.  He reports persistence of symptoms since Thursday that is not responding to [facility] powder at home.  He states pain is worsened with movement and is relieved with rest.  He reports pain radiating down posterior leg past the knee.  Denies saddle anesthesia, fever, weakness/sensory deficits, bowel/bladder dysfunction, history of IV drug use, known cancer diagnosis, chronic prednisone use.  He also describes pain radiating from the right hip to the right groin as well.  Denies any known traumas or fall.  Patient states the pain began Thursday after awakening from sleep.  Review of Systems  Positive: See above Negative:   Physical Exam  BP (!) 176/107 (BP Location: Right Arm)   Pulse 91   Temp 98.6 F (37 C) (Oral)   Resp 18   SpO2 98%  Gen:   Awake, no distress   Resp:  Normal effort  MSK:   Moves extremities without difficulty  Other:  No midline tenderness of lumbar spine with no obvious step-off or deformity.  No paraspinal tenderness bilaterally.  Medical Decision Making  Medically screening exam initiated at 12:59 PM.  Appropriate orders placed.  [name] [name] was informed that the remainder of the evaluation will be completed by another provider, this initial triage assessment does not replace that evaluation, and the importance of remaining in the ED until their evaluation is complete.     [name] [name], [place] 04/15/22 1300    [name] [name], [place] 04/15/22 1301

## 2022-04-15 NOTE — Discharge Instructions (Addendum)
Note the work-up today was overall reassuring.  Your x-ray was negative for any acute fracture or arthritis.  As discussed, we will treat this with Tylenol, Lidoderm patch as well as muscle laxer to use as needed.  May have to avoid NSAIDs due to your current use of Xarelto as a increased bleeding risk.  If the insurance did not cover Lidoderm patch, there is an over-the-counter version called Salonpas which works similarly.  Note that muscle laxer's can cause drowsiness so please do not drive; I recommend taking first dose in the evening to gauge his effects on you for drowsiness.  Recommend reevaluation in 3 to 5 days.  Please not hesitate to return to emergency department for worrisome signs and symptoms we discussed become apparent.

## 2022-04-15 NOTE — ED Triage Notes (Signed)
Pt presents with c/o right leg pain that starts at his hip and radiates down his whole leg. Pt reports this has been on and off for 2 years but the pain is usually intermittent and now it seems to be more consistent over the last 2 days.

## 2022-04-15 NOTE — ED Provider Notes (Signed)
[facility] DEPT Provider Note   CSN: [id number] Arrival date & time: 04/15/22  1158     History  Chief Complaint  Patient presents with   Leg Pain    [name] [name] is a 51 y.o. male.   Leg Pain   51 year old male presents emergency department with complaints of right-sided back and hip pain.  Patient states that symptoms been present since Thursday.  He notes history of similar symptoms that have gone away independent of medical therapy over the past few years.  He reports persistence of symptoms since Thursday that is not responding to [facility] powder at home.  He states pain is worsened with movement and is relieved with rest.  He reports pain radiating down posterior leg past the knee.  Denies saddle anesthesia, fever, weakness/sensory deficits, bowel/bladder dysfunction, history of IV drug use, known cancer diagnosis, chronic prednisone use.  He also describes pain radiating from the right hip to the right groin as well.  Denies any known traumas or fall.  Patient states the pain began Thursday after awakening from sleep.  Past medical history significant for diabetes mellitus, hypercholesterolemia, PE  Home Medications Prior to Admission medications   Medication Sig Start Date End Date Taking? Authorizing Provider  acetaminophen (TYLENOL) 325 MG tablet Take 2 tablets (650 mg total) by mouth every 6 (six) hours as needed for mild pain, moderate pain, fever or headache (or Fever >/= 101). 05/11/18   Wurst, [place], PA-C  atorvastatin (LIPITOR) 40 MG tablet Take 1 tablet (40 mg total) by mouth daily at 6 PM. 02/21/18 05/22/18  Revankar, Reita Cliche, MD  glimepiride (AMARYL) 1 MG tablet Take 1 mg by mouth every morning. 12/24/19   [provider]  hydrOXYzine (ATARAX/VISTARIL) 25 MG tablet Take 1 tablet (25 mg total) by mouth every 6 (six) hours. 01/10/20   Hall-Potvin, [place], PA-C  lidocaine (LIDODERM) 5 % Place 1 patch onto the skin daily. Remove &  Discard patch within 12 hours or as directed by MD 04/15/22  Yes Dion Saucier A, PA  metFORMIN (GLUCOPHAGE-XR) 500 MG 24 hr tablet Take 500 mg by mouth 2 (two) times daily. 11/22/19   [provider]  methocarbamol (ROBAXIN) 500 MG tablet Take 1 tablet (500 mg total) by mouth 2 (two) times daily. 04/15/22  Yes Dion Saucier A, PA  Multiple Vitamin (MULITIVITAMIN WITH MINERALS) TABS Take 1 tablet by mouth daily.    [provider]  predniSONE (DELTASONE) 10 MG tablet Take 1 tablet (10 mg total) by mouth daily with breakfast. 01/10/20   Hall-Potvin, [place], PA-C  rivaroxaban (XARELTO) 20 MG TABS tablet Take 20 mg by mouth daily with supper.    [provider]  triamcinolone ointment (KENALOG) 0.5 % Apply 1 application topically 2 (two) times daily. 01/10/20   Hall-Potvin, [place], PA-C      Allergies    Patient has no known allergies.    Review of Systems   Review of Systems  All other systems reviewed and are negative.   Physical Exam Updated Vital Signs BP (!) 156/117 (BP Location: Right Arm)   Pulse (!) 58   Temp 98.6 F (37 C) (Oral)   Resp 16   SpO2 98%  Physical Exam Vitals and nursing note reviewed.  Constitutional:      General: He is not in acute distress.    Appearance: He is well-developed.  HENT:     Head: Normocephalic and atraumatic.  Eyes:     Conjunctiva/sclera: Conjunctivae normal.  Cardiovascular:     Rate and Rhythm: Normal rate and regular rhythm.     Heart sounds: No murmur heard. Pulmonary:     Effort: Pulmonary effort is normal. No respiratory distress.     Breath sounds: Normal breath sounds.  Abdominal:     Palpations: Abdomen is soft.     Tenderness: There is no abdominal tenderness.  Musculoskeletal:        General: No swelling. Normal range of motion.     Cervical back: Neck supple.     Right lower leg: No edema.     Left lower leg: No edema.     Comments: Pain elicited with back flexion as well as extension.   Straight leg raise negative bilaterally.  No midline tenderness of cervical, thoracic, lumbar spine with no obvious step-off or deformity.  Mild paraspinal tenderness on the right side in the lumbar region.  Muscle strength out of 5 lower extremities.  No sensory deficits along major nerve distribution lower extremities.  Dorsalis pedis pulses full and intact bilaterally.  DTR symmetric and equal bilaterally.  No overlying skin abnormalities noted.  Patient has full active range of motion of bilateral hips, knees, ankles, digits.  Skin:    General: Skin is warm and dry.     Capillary Refill: Capillary refill takes less than 2 seconds.  Neurological:     Mental Status: He is alert.  Psychiatric:        Mood and Affect: Mood normal.     ED Results / Procedures / Treatments   Labs (all labs ordered are listed, but only abnormal results are displayed) Labs Reviewed - No data to display  EKG None  Radiology DG Hip Unilat W or Wo Pelvis 2-3 Views Right  Result Date: 04/15/2022 CLINICAL DATA:  Hip pain.  No known injury. EXAM: DG HIP (WITH OR WITHOUT PELVIS) 2-3V RIGHT COMPARISON:  None Available. FINDINGS: There is no evidence of hip fracture or dislocation. There is no evidence of arthropathy or other focal bone abnormality. IMPRESSION: Negative. Electronically Signed   By: Kerby Moors M.D.   On: 04/15/2022 13:28    Procedures Procedures    Medications Ordered in ED Medications  lidocaine (LIDODERM) 5 % 1 patch (1 patch Transdermal Patch Applied 04/15/22 1539)  ketorolac (TORADOL) 15 MG/ML injection 15 mg (15 mg Intramuscular Given 04/15/22 1540)    ED Course/ Medical Decision Making/ A&P                           Medical Decision Making Amount and/or Complexity of Data Reviewed Radiology: ordered.  Risk Prescription drug management.   This patient presents to the ED for concern of back pain, this involves an extensive number of treatment options, and is a complaint that  carries with it a high risk of complications and morbidity.  The differential diagnosis includes fracture, muscle strain, cauda equina, spinal stenosis. DDD, ankylosing spondylitis, acute ligamentous injury, disk herniation, spondylolisthesis, Epidural compression syndrome, metastatic cancer, transverse myelitis, vertebral osteomyelitis, diskitis   Co morbidities that complicate the patient evaluation  See HPI   Additional history obtained:  Additional history obtained from EMR External records from outside source obtained and reviewed including hospital records   Lab Tests:  N/a   Imaging Studies ordered:  I ordered imaging including right hip x-ray. No acute fracture or dislocation or concerning findings consistent for osteoarthritis/AVN. I independently reviewed imaging studies and was in agreement with radiologist interpretation.  Cardiac Monitoring: / EKG:  The patient was maintained on a cardiac monitor.  I personally viewed and interpreted the cardiac monitored which showed an underlying rhythm of: Sinus rhythm   Consultations Obtained:  N/a   Problem List / ED Course / Critical interventions / Medication management  Back pain I ordered medication including Toradol Lidoderm for pain   Reevaluation of the patient after these medicines showed that the patient improved I have reviewed the patients home medicines and have made adjustments as needed   Social Determinants of Health:  Former cigarette use.  Denies illicit drug use.   Test / Admission - Considered:  Back pain Vitals signs significant for hypertension with a blood pressure 156; recommend close follow-up with PCP regarding elevation of blood pressure.. Otherwise within normal range and stable throughout visit. Laboratory/imaging studies considered but deemed necessary due to lack of traumatic mechanism as well as red flag signs concerning back pain.  Doubt spinal epidural abscess, cauda equina,  acute fracture/dislocation.  Patient's symptoms likely secondary to muscular strain.  Patient describing sciatica type symptoms but without reproducibility on exam.  Given patient's known diabetic status, will avoid prednisone outpatient.  Patient to be prescribed NSAID, Lidoderm patch as well as muscle laxer to use as needed.  Close follow-up in 3 to 5 days recommended for reevaluation.  Treatment plan discussed with patient he knowledge understand was agreeable to said plan.  Patient also cautioned regarding the harmful side effects of muscle relaxers. Worrisome signs and symptoms were discussed with the patient, and the patient acknowledged understanding to return to the ED if noticed. Patient was stable upon discharge.          Final Clinical Impression(s) / ED Diagnoses Final diagnoses:  Acute low back pain with right-sided sciatica, unspecified back pain laterality    Rx / DC Orders ED Discharge Orders          Ordered    lidocaine (LIDODERM) 5 %  Every 24 hours        04/15/22 1609    methocarbamol (ROBAXIN) 500 MG tablet  2 times daily        04/15/22 1609              Wilnette Kales, [place] 04/15/22 1609    Tretha Sciara, MD 04/15/22 2253

## 2022-04-20 DIAGNOSIS — M25551 Pain in right hip: Secondary | ICD-10-CM | POA: Diagnosis not present

## 2022-04-20 DIAGNOSIS — I10 Essential (primary) hypertension: Secondary | ICD-10-CM | POA: Diagnosis not present

## 2022-04-20 DIAGNOSIS — M5441 Lumbago with sciatica, right side: Secondary | ICD-10-CM | POA: Diagnosis not present

## 2022-04-20 DIAGNOSIS — M62551 Muscle wasting and atrophy, not elsewhere classified, right thigh: Secondary | ICD-10-CM | POA: Diagnosis not present

## 2022-04-21 DIAGNOSIS — M5441 Lumbago with sciatica, right side: Secondary | ICD-10-CM | POA: Diagnosis not present

## 2022-04-21 DIAGNOSIS — M25551 Pain in right hip: Secondary | ICD-10-CM | POA: Diagnosis not present

## 2022-04-21 DIAGNOSIS — M62551 Muscle wasting and atrophy, not elsewhere classified, right thigh: Secondary | ICD-10-CM | POA: Diagnosis not present

## 2022-04-23 DIAGNOSIS — M25551 Pain in right hip: Secondary | ICD-10-CM | POA: Diagnosis not present

## 2022-04-23 DIAGNOSIS — M62551 Muscle wasting and atrophy, not elsewhere classified, right thigh: Secondary | ICD-10-CM | POA: Diagnosis not present

## 2022-04-23 DIAGNOSIS — M5441 Lumbago with sciatica, right side: Secondary | ICD-10-CM | POA: Diagnosis not present

## 2022-04-27 DIAGNOSIS — M62551 Muscle wasting and atrophy, not elsewhere classified, right thigh: Secondary | ICD-10-CM | POA: Diagnosis not present

## 2022-04-27 DIAGNOSIS — M25551 Pain in right hip: Secondary | ICD-10-CM | POA: Diagnosis not present

## 2022-04-27 DIAGNOSIS — M5441 Lumbago with sciatica, right side: Secondary | ICD-10-CM | POA: Diagnosis not present

## 2022-04-30 DIAGNOSIS — M25551 Pain in right hip: Secondary | ICD-10-CM | POA: Diagnosis not present

## 2022-04-30 DIAGNOSIS — M62551 Muscle wasting and atrophy, not elsewhere classified, right thigh: Secondary | ICD-10-CM | POA: Diagnosis not present

## 2022-04-30 DIAGNOSIS — M5441 Lumbago with sciatica, right side: Secondary | ICD-10-CM | POA: Diagnosis not present

## 2022-05-04 DIAGNOSIS — M5441 Lumbago with sciatica, right side: Secondary | ICD-10-CM | POA: Diagnosis not present

## 2022-05-04 DIAGNOSIS — M25551 Pain in right hip: Secondary | ICD-10-CM | POA: Diagnosis not present

## 2022-05-04 DIAGNOSIS — M62551 Muscle wasting and atrophy, not elsewhere classified, right thigh: Secondary | ICD-10-CM | POA: Diagnosis not present

## 2022-05-07 DIAGNOSIS — E1169 Type 2 diabetes mellitus with other specified complication: Secondary | ICD-10-CM | POA: Diagnosis not present

## 2022-05-07 DIAGNOSIS — M5416 Radiculopathy, lumbar region: Secondary | ICD-10-CM | POA: Diagnosis not present

## 2022-05-11 DIAGNOSIS — M25551 Pain in right hip: Secondary | ICD-10-CM | POA: Diagnosis not present

## 2022-05-11 DIAGNOSIS — M5441 Lumbago with sciatica, right side: Secondary | ICD-10-CM | POA: Diagnosis not present

## 2022-05-11 DIAGNOSIS — M62551 Muscle wasting and atrophy, not elsewhere classified, right thigh: Secondary | ICD-10-CM | POA: Diagnosis not present

## 2022-05-17 ENCOUNTER — Other Ambulatory Visit: Payer: Self-pay | Admitting: Family Medicine

## 2022-05-17 DIAGNOSIS — M5416 Radiculopathy, lumbar region: Secondary | ICD-10-CM

## 2022-05-18 ENCOUNTER — Ambulatory Visit
Admission: RE | Admit: 2022-05-18 | Discharge: 2022-05-18 | Disposition: A | Discharge status: Home / Self Care | Payer: BC Managed Care – PPO | Source: Ambulatory Visit | Attending: Family Medicine | Admitting: Family Medicine

## 2022-05-18 DIAGNOSIS — M5441 Lumbago with sciatica, right side: Secondary | ICD-10-CM | POA: Diagnosis not present

## 2022-05-18 DIAGNOSIS — M4807 Spinal stenosis, lumbosacral region: Secondary | ICD-10-CM | POA: Diagnosis not present

## 2022-05-18 DIAGNOSIS — M5416 Radiculopathy, lumbar region: Secondary | ICD-10-CM

## 2022-05-18 DIAGNOSIS — M545 Low back pain, unspecified: Secondary | ICD-10-CM | POA: Diagnosis not present

## 2022-05-18 DIAGNOSIS — M48061 Spinal stenosis, lumbar region without neurogenic claudication: Secondary | ICD-10-CM | POA: Diagnosis not present

## 2022-05-18 DIAGNOSIS — M25551 Pain in right hip: Secondary | ICD-10-CM | POA: Diagnosis not present

## 2022-05-18 DIAGNOSIS — M79605 Pain in left leg: Secondary | ICD-10-CM | POA: Diagnosis not present

## 2022-05-18 DIAGNOSIS — M62551 Muscle wasting and atrophy, not elsewhere classified, right thigh: Secondary | ICD-10-CM | POA: Diagnosis not present

## 2022-05-19 DIAGNOSIS — M5441 Lumbago with sciatica, right side: Secondary | ICD-10-CM | POA: Diagnosis not present

## 2022-05-19 DIAGNOSIS — M25551 Pain in right hip: Secondary | ICD-10-CM | POA: Diagnosis not present

## 2022-05-19 DIAGNOSIS — M62551 Muscle wasting and atrophy, not elsewhere classified, right thigh: Secondary | ICD-10-CM | POA: Diagnosis not present

## 2022-05-22 DIAGNOSIS — M5126 Other intervertebral disc displacement, lumbar region: Secondary | ICD-10-CM | POA: Diagnosis not present

## 2022-05-22 DIAGNOSIS — Z6834 Body mass index (BMI) 34.0-34.9, adult: Secondary | ICD-10-CM | POA: Diagnosis not present

## 2022-05-30 DIAGNOSIS — M25551 Pain in right hip: Secondary | ICD-10-CM | POA: Diagnosis not present

## 2022-05-30 DIAGNOSIS — M5441 Lumbago with sciatica, right side: Secondary | ICD-10-CM | POA: Diagnosis not present

## 2022-05-30 DIAGNOSIS — M62551 Muscle wasting and atrophy, not elsewhere classified, right thigh: Secondary | ICD-10-CM | POA: Diagnosis not present

## 2022-06-13 ENCOUNTER — Other Ambulatory Visit: Payer: Self-pay | Admitting: Neurosurgery

## 2022-06-13 DIAGNOSIS — D334 Benign neoplasm of spinal cord: Secondary | ICD-10-CM

## 2022-07-14 ENCOUNTER — Ambulatory Visit
Admission: RE | Admit: 2022-07-14 | Discharge: 2022-07-14 | Disposition: A | Discharge status: Home / Self Care | Payer: BC Managed Care – PPO | Source: Ambulatory Visit | Attending: Neurosurgery | Admitting: Neurosurgery

## 2022-07-14 DIAGNOSIS — D334 Benign neoplasm of spinal cord: Secondary | ICD-10-CM

## 2022-07-14 MED ORDER — GADOPICLENOL 0.5 MMOL/ML IV SOLN
10.0000 mL | Freq: Once | INTRAVENOUS | Status: AC | PRN
  Administered 2022-07-14: 10 mL via INTRAVENOUS

## 2024-04-10 ENCOUNTER — Other Ambulatory Visit: Payer: Self-pay | Admitting: Medical Genetics

## 2024-05-28 ENCOUNTER — Encounter: Payer: Self-pay | Admitting: Internal Medicine

## 2024-05-28 ENCOUNTER — Ambulatory Visit: Attending: Cardiology | Admitting: Internal Medicine

## 2024-05-28 VITALS — BP 146/82 | HR 68 | Ht 69.0 in | Wt 238.0 lb

## 2024-05-28 DIAGNOSIS — E785 Hyperlipidemia, unspecified: Secondary | ICD-10-CM

## 2024-05-28 DIAGNOSIS — Z8249 Family history of ischemic heart disease and other diseases of the circulatory system: Secondary | ICD-10-CM | POA: Insufficient documentation

## 2024-05-28 DIAGNOSIS — E119 Type 2 diabetes mellitus without complications: Secondary | ICD-10-CM | POA: Insufficient documentation

## 2024-05-28 DIAGNOSIS — R0602 Shortness of breath: Secondary | ICD-10-CM

## 2024-05-28 DIAGNOSIS — I10 Essential (primary) hypertension: Secondary | ICD-10-CM | POA: Insufficient documentation

## 2024-05-28 DIAGNOSIS — G4733 Obstructive sleep apnea (adult) (pediatric): Secondary | ICD-10-CM

## 2024-05-28 DIAGNOSIS — R9431 Abnormal electrocardiogram [ECG] [EKG]: Secondary | ICD-10-CM

## 2024-05-28 NOTE — Patient Instructions (Signed)
 Medication Instructions:  No changes  *If you need a refill on your cardiac medications before your next appointment, please call your pharmacy*   Lab Work: fasting IN 6 MONTHS- May 2026  NMR Lp(a) If you have labs (blood work) drawn today and your tests are completely normal, you will receive your results only by: MyChart Message (if you have MyChart) OR A paper copy in the mail If you have any lab test that is abnormal or we need to change your treatment, we will call you to review the results.   Testing/Procedures:  Your physician has requested that you have an echocardiogram. Echocardiography is a painless test that uses sound waves to create images of your heart. It provides your doctor with information about the size and shape of your heart and how well your heart's chambers and valves are working. This procedure takes approximately one hour. There are no restrictions for this procedure. Please do NOT wear cologne, perfume, aftershave, or lotions (deodorant is allowed). Please arrive 15 minutes prior to your appointment time.  Please note: We ask at that you not bring children with you during ultrasound (echo/ vascular) testing. Due to room size and safety concerns, children are not allowed in the ultrasound rooms during exams. Our front office staff cannot provide observation of children in our lobby area while testing is being conducted. An adult accompanying a patient to their appointment will only be allowed in the ultrasound room at the discretion of the ultrasound technician under special circumstances. We apologize for any inconvenience.   Follow-Up: At [facility], you and your health needs are our priority.  As part of our continuing mission to provide you with exceptional heart care, we have created designated Provider Care Teams.  These Care Teams include your primary Cardiologist (physician) and Advanced Practice Providers (APPs -  Physician Assistants and Nurse  Practitioners) who all work together to provide you with the care you need, when you need it.     Your next appointment:   6 month(s)  The format for your next appointment:   In Person  Provider:   Vinie JAYSON Maxcy, MD

## 2024-05-28 NOTE — Progress Notes (Signed)
 OFFICE CONSULT NOTE  Chief Complaint:  Cardiac risk counseling, family history of heart disease  Primary Care Physician: [name] Carlin Redbird, MD  HPI:  [name] [name] is a 53 y.o. male who is being seen today for the evaluation of cardiac risk counseling at the request of [name] Carlin Redbird, MD. this is a pleasant 53 year old male kindly referred for evaluation management of cardiovascular risk.  He reports a family history of heart failure and multiple family members including his father who currently has heart failure in his paternal grandfather died of heart failure.  There also was some coronary artery disease and hypertension in the family.  His personal risk factors include hypertension, dyslipidemia, type 2 diabetes and obstructive sleep apnea.  He is also had prior pulmonary emboli twice and he is on lifelong Xarelto .  He previously exercise but is now more active and generally works about 12 hours a day so does not exercise much.  He does note that he gets some shortness of breath when walking upstairs.  EKG today was mildly abnormal showing normal sinus rhythm but voltage criteria for LVH.  This could be due to hypertension although given his family history of heart failure I would worry about possible infiltrative diseases.  He denies any chest pain.  He had lipid testing in March 2025 which showed total cholesterol 150, HDL 47, triglycerides 81 and LDL 87 however repeat labs in September showed an increase in cholesterol with total cholesterol 214, HDL 44, triglycerides 169 and LDL 140.  He is not aware as to why the cholesterol had gone up.  He does report compliance with atorvastatin  and ezetimibe.  He has been less physically active but he does not report any dietary changes.  He has struggled with control of his blood pressure.  Recently his endocrinologist had increased his amlodipine further from 2.5 to 5 mg daily about a week ago.  Blood pressure today was 146/82.  PMHx:  Past  Medical History:  Diagnosis Date   Diabetes mellitus without complication (HCC)    Hypercholesteremia    OSA (obstructive sleep apnea)    Pulmonary emboli (HCC)     Past Surgical History:  Procedure Laterality Date   EYE SURGERY      FAMHx:  Family History  Problem Relation Age of Onset   Diabetes Mother    Cancer Mother    Hypertension Father    Pulmonary embolism Sister    High Cholesterol Sister    Healthy Sister    Drug abuse Brother    Suicidality Brother    Dementia Maternal Grandmother    Clotting disorder Maternal Grandmother    Pulmonary embolism Other     SOCHx:   reports that he quit smoking about 16 years ago. His smoking use included cigarettes. He started smoking about 36 years ago. He has a 10 pack-year smoking history. He has never used smokeless tobacco. He reports current alcohol use. He reports that he does not use drugs.  ALLERGIES:  No Known Allergies  ROS: Pertinent items noted in HPI and remainder of comprehensive ROS otherwise negative.  HOME MEDS: Current Outpatient Medications on File Prior to Visit  Medication Sig Dispense Refill   amLODipine (NORVASC) 5 MG tablet Take 5 mg by mouth daily.     ezetimibe (ZETIA) 10 MG tablet Take 10 mg by mouth daily.     irbesartan (AVAPRO) 300 MG tablet Take 300 mg by mouth daily.     metFORMIN (GLUCOPHAGE-XR) 500 MG 24  hr tablet Take 500 mg by mouth 2 (two) times daily.     Multiple Vitamin (MULITIVITAMIN WITH MINERALS) TABS Take 1 tablet by mouth daily.     OZEMPIC, 0.25 OR 0.5 MG/DOSE, 2 MG/3ML SOPN Inject 2 mg into the skin once a week.     rivaroxaban  (XARELTO ) 20 MG TABS tablet Take 20 mg by mouth daily with supper.     acetaminophen  (TYLENOL ) 325 MG tablet Take 2 tablets (650 mg total) by mouth every 6 (six) hours as needed for mild pain, moderate pain, fever or headache (or Fever >/= 101). (Patient not taking: Reported on 05/28/2024) 20 tablet 0   atorvastatin  (LIPITOR) 40 MG tablet Take 1 tablet  (40 mg total) by mouth daily at 6 PM. (Patient not taking: Reported on 05/28/2024) 90 tablet 3   glimepiride (AMARYL) 1 MG tablet Take 1 mg by mouth every morning. (Patient not taking: Reported on 05/28/2024)     hydrOXYzine  (ATARAX /VISTARIL ) 25 MG tablet Take 1 tablet (25 mg total) by mouth every 6 (six) hours. (Patient not taking: Reported on 05/28/2024) 12 tablet 0   lidocaine  (LIDODERM ) 5 % Place 1 patch onto the skin daily. Remove & Discard patch within 12 hours or as directed by MD (Patient not taking: Reported on 05/28/2024) 30 patch 0   methocarbamol  (ROBAXIN ) 500 MG tablet Take 1 tablet (500 mg total) by mouth 2 (two) times daily. (Patient not taking: Reported on 05/28/2024) 20 tablet 0   predniSONE  (DELTASONE ) 10 MG tablet Take 1 tablet (10 mg total) by mouth daily with breakfast. (Patient not taking: Reported on 05/28/2024) 21 tablet 0   triamcinolone  ointment (KENALOG ) 0.5 % Apply 1 application topically 2 (two) times daily. (Patient not taking: Reported on 05/28/2024) 30 g 0   No current facility-administered medications on file prior to visit.    LABS/IMAGING: No results found for this or any previous visit (from the past 48 hours). No results found.  LIPID PANEL:    Component Value Date/Time   CHOL 212 (H) 02/19/2018 1210   TRIG 227 (H) 02/19/2018 1210   HDL 35 (L) 02/19/2018 1210   CHOLHDL 6.1 (H) 02/19/2018 1210   LDLCALC 132 (H) 02/19/2018 1210    WEIGHTS: Wt Readings from Last 3 Encounters:  05/28/24 238 lb (108 kg)  12/03/19 262 lb 1.6 oz (118.9 kg)  05/12/18 267 lb (121.1 kg)    VITALS: BP (!) 146/82   Pulse 68   Ht 5' 9 (1.753 m)   Wt 238 lb (108 kg)   SpO2 96%   BMI 35.15 kg/m   EXAM: General appearance: alert and no distress Lungs: clear to auscultation bilaterally Heart: regular rate and rhythm, S1, S2 normal, no murmur, click, rub or gallop Extremities: extremities normal, atraumatic, no cyanosis or edema Neurologic: Grossly normal  EKG: EKG  Interpretation Date/Time:  Thursday May 28 2024 08:19:31 EST Ventricular Rate:  65 PR Interval:  188 QRS Duration:  108 QT Interval:  398 QTC Calculation: 413 R Axis:   5  Text Interpretation: Normal sinus rhythm Minimal voltage criteria for LVH, may be normal variant ( Cornell product ) When compared with ECG of 07-Apr-2015 06:50, PREVIOUS ECG IS PRESENT Confirmed by Mona Kent [id number]) on 05/28/2024 8:22:03 AM  - personally reviewed  ASSESSMENT: Family history of congestive heart failure Hypertension Type 2 diabetes-A1c 6.5% Dyslipidemia, goal LDL less than 70 OSA on CPAP Moderate obesity History of PE x 2 on lifelong Xarelto   PLAN: 1.   Mr. Aber  has multiple cardiovascular risk factors.  It sounds like it is possible that the family history of heart failure may be related to hypertension or cardiovascular disease although is not totally clear.  He reports some shortness of breath mostly when walking upstairs and has EKG changes concerning for LVH.  This may be due to hypertension.  I like to get a baseline echocardiogram to further evaluate this especially given his family history of congestive heart failure.  His cholesterol recently increased.  Have encouraged him to monitor diet and lower saturated fats and ensure compliance with his medications.  Will plan to repeat lipid NMR and LP(a) which is procoagulant and might explain recurrent pulmonary emboli, in 6 months.  I have also encouraged him to take home blood pressure readings and reach out to me with those as his blood pressure was somewhat elevated today however he just recently had an increase in his amlodipine.  He may need additional therapy to target blood pressure to 120/80 or lower.  Weight loss will be helpful for this.  He has been on Ozempic but he says recently this was not renewed for him.  I have encouraged to reach out to his endocrinologist regarding this.  As above plan follow-up with me in 6 months or  sooner as necessary.  Thanks again for the kind referral.  Vinie KYM Maxcy, MD, [facility], FNLA, FACP  [place]  [facility]  Medical Director of the [facility] Lipid Disorders &  Cardiovascular Risk Reduction Clinic Diplomate of the American Board of Clinical Lipidology Attending Cardiologist  Direct Dial: [phone]  Fax: [phone]  Website:  [email or web address] Vinie BROCKS Vivienne Sangiovanni 05/28/2024, 8:42 AM

## 2024-06-17 ENCOUNTER — Ambulatory Visit (HOSPITAL_COMMUNITY)
Admission: RE | Admit: 2024-06-17 | Discharge: 2024-06-17 | Disposition: A | Discharge status: Home / Self Care | Source: Ambulatory Visit | Attending: Cardiology | Admitting: Cardiology

## 2024-06-17 DIAGNOSIS — R0602 Shortness of breath: Secondary | ICD-10-CM | POA: Diagnosis not present

## 2024-06-17 DIAGNOSIS — Z8249 Family history of ischemic heart disease and other diseases of the circulatory system: Secondary | ICD-10-CM | POA: Insufficient documentation

## 2024-06-17 LAB — ECHOCARDIOGRAM COMPLETE
Area-P 1/2: 4.49 cm2
S' Lateral: 3 cm

## 2024-06-17 MED ORDER — PERFLUTREN LIPID MICROSPHERE
1.0000 mL | INTRAVENOUS | Status: AC | PRN
  Administered 2024-06-17: 2 mL via INTRAVENOUS

## 2024-06-22 ENCOUNTER — Ambulatory Visit: Payer: Self-pay | Admitting: Internal Medicine
